# Patient Record
Sex: Female | Born: 1935 | Race: White | Hispanic: No | Marital: Married | Smoking: Never smoker
Health system: Southern US, Community
[De-identification: ages and names within clinical notes are randomized; demographics above are authoritative.]

## PROBLEM LIST (undated history)

## (undated) DIAGNOSIS — M1990 Unspecified osteoarthritis, unspecified site: Secondary | ICD-10-CM

## (undated) DIAGNOSIS — E079 Disorder of thyroid, unspecified: Secondary | ICD-10-CM

## (undated) DIAGNOSIS — L719 Rosacea, unspecified: Secondary | ICD-10-CM

## (undated) DIAGNOSIS — D510 Vitamin B12 deficiency anemia due to intrinsic factor deficiency: Secondary | ICD-10-CM

## (undated) DIAGNOSIS — I10 Essential (primary) hypertension: Secondary | ICD-10-CM

## (undated) DIAGNOSIS — C801 Malignant (primary) neoplasm, unspecified: Secondary | ICD-10-CM

## (undated) DIAGNOSIS — K219 Gastro-esophageal reflux disease without esophagitis: Secondary | ICD-10-CM

## (undated) DIAGNOSIS — I872 Venous insufficiency (chronic) (peripheral): Secondary | ICD-10-CM

## (undated) DIAGNOSIS — E669 Obesity, unspecified: Secondary | ICD-10-CM

## (undated) HISTORY — PX: TONSILLECTOMY AND ADENOIDECTOMY: SUR1326

## (undated) HISTORY — DX: Malignant (primary) neoplasm, unspecified: C80.1

## (undated) HISTORY — DX: Vitamin B12 deficiency anemia due to intrinsic factor deficiency: D51.0

## (undated) HISTORY — DX: Disorder of thyroid, unspecified: E07.9

## (undated) HISTORY — DX: Unspecified osteoarthritis, unspecified site: M19.90

## (undated) HISTORY — DX: Venous insufficiency (chronic) (peripheral): I87.2

## (undated) HISTORY — DX: Gastro-esophageal reflux disease without esophagitis: K21.9

## (undated) HISTORY — PX: CHOLECYSTECTOMY: SHX55

## (undated) HISTORY — PX: TOTAL THYROIDECTOMY: SHX2547

## (undated) HISTORY — DX: Obesity, unspecified: E66.9

## (undated) HISTORY — DX: Essential (primary) hypertension: I10

## (undated) HISTORY — DX: Rosacea, unspecified: L71.9

---

## 2011-06-15 DIAGNOSIS — J069 Acute upper respiratory infection, unspecified: Secondary | ICD-10-CM | POA: Diagnosis not present

## 2011-11-01 DIAGNOSIS — W57XXXA Bitten or stung by nonvenomous insect and other nonvenomous arthropods, initial encounter: Secondary | ICD-10-CM | POA: Diagnosis not present

## 2011-11-01 DIAGNOSIS — L820 Inflamed seborrheic keratosis: Secondary | ICD-10-CM | POA: Diagnosis not present

## 2011-11-01 DIAGNOSIS — T148 Other injury of unspecified body region: Secondary | ICD-10-CM | POA: Diagnosis not present

## 2011-11-01 DIAGNOSIS — L981 Factitial dermatitis: Secondary | ICD-10-CM | POA: Diagnosis not present

## 2011-11-25 DIAGNOSIS — Z9089 Acquired absence of other organs: Secondary | ICD-10-CM | POA: Diagnosis not present

## 2011-11-25 DIAGNOSIS — Z923 Personal history of irradiation: Secondary | ICD-10-CM | POA: Diagnosis not present

## 2011-11-25 DIAGNOSIS — C73 Malignant neoplasm of thyroid gland: Secondary | ICD-10-CM | POA: Diagnosis not present

## 2011-11-25 DIAGNOSIS — T888XXA Other specified complications of surgical and medical care, not elsewhere classified, initial encounter: Secondary | ICD-10-CM | POA: Diagnosis not present

## 2012-02-02 DIAGNOSIS — H2510 Age-related nuclear cataract, unspecified eye: Secondary | ICD-10-CM | POA: Diagnosis not present

## 2012-02-02 DIAGNOSIS — H04129 Dry eye syndrome of unspecified lacrimal gland: Secondary | ICD-10-CM | POA: Diagnosis not present

## 2012-02-08 DIAGNOSIS — R609 Edema, unspecified: Secondary | ICD-10-CM | POA: Diagnosis not present

## 2012-02-08 DIAGNOSIS — K219 Gastro-esophageal reflux disease without esophagitis: Secondary | ICD-10-CM | POA: Diagnosis not present

## 2012-02-08 DIAGNOSIS — I10 Essential (primary) hypertension: Secondary | ICD-10-CM | POA: Diagnosis not present

## 2012-02-08 DIAGNOSIS — Z1231 Encounter for screening mammogram for malignant neoplasm of breast: Secondary | ICD-10-CM | POA: Diagnosis not present

## 2012-02-08 DIAGNOSIS — Z23 Encounter for immunization: Secondary | ICD-10-CM | POA: Diagnosis not present

## 2012-03-27 DIAGNOSIS — L820 Inflamed seborrheic keratosis: Secondary | ICD-10-CM | POA: Diagnosis not present

## 2012-03-27 DIAGNOSIS — L821 Other seborrheic keratosis: Secondary | ICD-10-CM | POA: Diagnosis not present

## 2012-04-06 DIAGNOSIS — M25519 Pain in unspecified shoulder: Secondary | ICD-10-CM | POA: Diagnosis not present

## 2012-04-06 DIAGNOSIS — M67919 Unspecified disorder of synovium and tendon, unspecified shoulder: Secondary | ICD-10-CM | POA: Diagnosis not present

## 2012-04-06 DIAGNOSIS — M7520 Bicipital tendinitis, unspecified shoulder: Secondary | ICD-10-CM | POA: Diagnosis not present

## 2012-04-06 DIAGNOSIS — M19019 Primary osteoarthritis, unspecified shoulder: Secondary | ICD-10-CM | POA: Diagnosis not present

## 2012-04-06 DIAGNOSIS — M719 Bursopathy, unspecified: Secondary | ICD-10-CM | POA: Diagnosis not present

## 2012-04-13 DIAGNOSIS — Z79899 Other long term (current) drug therapy: Secondary | ICD-10-CM | POA: Diagnosis not present

## 2012-04-13 DIAGNOSIS — J309 Allergic rhinitis, unspecified: Secondary | ICD-10-CM | POA: Diagnosis not present

## 2012-04-13 DIAGNOSIS — I10 Essential (primary) hypertension: Secondary | ICD-10-CM | POA: Diagnosis not present

## 2012-04-13 DIAGNOSIS — D539 Nutritional anemia, unspecified: Secondary | ICD-10-CM | POA: Diagnosis not present

## 2012-04-30 DIAGNOSIS — M25529 Pain in unspecified elbow: Secondary | ICD-10-CM | POA: Diagnosis not present

## 2012-04-30 DIAGNOSIS — M7020 Olecranon bursitis, unspecified elbow: Secondary | ICD-10-CM | POA: Diagnosis not present

## 2012-05-14 DIAGNOSIS — M7020 Olecranon bursitis, unspecified elbow: Secondary | ICD-10-CM | POA: Diagnosis not present

## 2012-05-14 DIAGNOSIS — M25529 Pain in unspecified elbow: Secondary | ICD-10-CM | POA: Diagnosis not present

## 2012-05-18 DIAGNOSIS — M719 Bursopathy, unspecified: Secondary | ICD-10-CM | POA: Diagnosis not present

## 2012-05-18 DIAGNOSIS — M7520 Bicipital tendinitis, unspecified shoulder: Secondary | ICD-10-CM | POA: Diagnosis not present

## 2012-05-18 DIAGNOSIS — M19019 Primary osteoarthritis, unspecified shoulder: Secondary | ICD-10-CM | POA: Diagnosis not present

## 2012-05-18 DIAGNOSIS — M25519 Pain in unspecified shoulder: Secondary | ICD-10-CM | POA: Diagnosis not present

## 2012-05-18 DIAGNOSIS — M67919 Unspecified disorder of synovium and tendon, unspecified shoulder: Secondary | ICD-10-CM | POA: Diagnosis not present

## 2012-05-31 DIAGNOSIS — D510 Vitamin B12 deficiency anemia due to intrinsic factor deficiency: Secondary | ICD-10-CM | POA: Diagnosis not present

## 2012-05-31 DIAGNOSIS — K219 Gastro-esophageal reflux disease without esophagitis: Secondary | ICD-10-CM | POA: Diagnosis not present

## 2012-05-31 DIAGNOSIS — E039 Hypothyroidism, unspecified: Secondary | ICD-10-CM | POA: Diagnosis not present

## 2012-05-31 DIAGNOSIS — I10 Essential (primary) hypertension: Secondary | ICD-10-CM | POA: Diagnosis not present

## 2012-06-08 DIAGNOSIS — D510 Vitamin B12 deficiency anemia due to intrinsic factor deficiency: Secondary | ICD-10-CM | POA: Diagnosis not present

## 2012-06-14 DIAGNOSIS — D510 Vitamin B12 deficiency anemia due to intrinsic factor deficiency: Secondary | ICD-10-CM | POA: Diagnosis not present

## 2012-06-18 DIAGNOSIS — M19019 Primary osteoarthritis, unspecified shoulder: Secondary | ICD-10-CM | POA: Diagnosis not present

## 2012-06-18 DIAGNOSIS — M25519 Pain in unspecified shoulder: Secondary | ICD-10-CM | POA: Diagnosis not present

## 2012-06-18 DIAGNOSIS — M719 Bursopathy, unspecified: Secondary | ICD-10-CM | POA: Diagnosis not present

## 2012-06-18 DIAGNOSIS — M67919 Unspecified disorder of synovium and tendon, unspecified shoulder: Secondary | ICD-10-CM | POA: Diagnosis not present

## 2012-06-18 DIAGNOSIS — M7020 Olecranon bursitis, unspecified elbow: Secondary | ICD-10-CM | POA: Diagnosis not present

## 2012-06-22 DIAGNOSIS — D510 Vitamin B12 deficiency anemia due to intrinsic factor deficiency: Secondary | ICD-10-CM | POA: Diagnosis not present

## 2012-06-29 DIAGNOSIS — D510 Vitamin B12 deficiency anemia due to intrinsic factor deficiency: Secondary | ICD-10-CM | POA: Diagnosis not present

## 2012-07-27 DIAGNOSIS — D510 Vitamin B12 deficiency anemia due to intrinsic factor deficiency: Secondary | ICD-10-CM | POA: Diagnosis not present

## 2012-08-15 ENCOUNTER — Other Ambulatory Visit: Payer: Self-pay | Admitting: Physician Assistant

## 2012-08-15 DIAGNOSIS — K56609 Unspecified intestinal obstruction, unspecified as to partial versus complete obstruction: Secondary | ICD-10-CM | POA: Diagnosis not present

## 2012-08-15 DIAGNOSIS — K635 Polyp of colon: Secondary | ICD-10-CM

## 2012-08-15 DIAGNOSIS — Z86010 Personal history of colonic polyps: Secondary | ICD-10-CM | POA: Diagnosis not present

## 2012-08-15 DIAGNOSIS — Z800 Family history of malignant neoplasm of digestive organs: Secondary | ICD-10-CM | POA: Diagnosis not present

## 2012-08-23 ENCOUNTER — Ambulatory Visit
Admission: RE | Admit: 2012-08-23 | Discharge: 2012-08-23 | Disposition: A | Discharge status: Home / Self Care | Payer: Medicare Other | Source: Ambulatory Visit | Attending: Physician Assistant | Admitting: Physician Assistant

## 2012-08-23 DIAGNOSIS — K635 Polyp of colon: Secondary | ICD-10-CM

## 2012-08-23 DIAGNOSIS — K5730 Diverticulosis of large intestine without perforation or abscess without bleeding: Secondary | ICD-10-CM | POA: Diagnosis not present

## 2012-08-24 ENCOUNTER — Other Ambulatory Visit: Payer: Self-pay

## 2012-08-27 DIAGNOSIS — D510 Vitamin B12 deficiency anemia due to intrinsic factor deficiency: Secondary | ICD-10-CM | POA: Diagnosis not present

## 2012-09-20 DIAGNOSIS — L981 Factitial dermatitis: Secondary | ICD-10-CM | POA: Diagnosis not present

## 2012-09-20 DIAGNOSIS — L821 Other seborrheic keratosis: Secondary | ICD-10-CM | POA: Diagnosis not present

## 2012-09-20 DIAGNOSIS — L820 Inflamed seborrheic keratosis: Secondary | ICD-10-CM | POA: Diagnosis not present

## 2012-10-01 DIAGNOSIS — D510 Vitamin B12 deficiency anemia due to intrinsic factor deficiency: Secondary | ICD-10-CM | POA: Diagnosis not present

## 2012-11-01 DIAGNOSIS — D510 Vitamin B12 deficiency anemia due to intrinsic factor deficiency: Secondary | ICD-10-CM | POA: Diagnosis not present

## 2012-11-27 DIAGNOSIS — D510 Vitamin B12 deficiency anemia due to intrinsic factor deficiency: Secondary | ICD-10-CM | POA: Diagnosis not present

## 2012-12-07 DIAGNOSIS — C73 Malignant neoplasm of thyroid gland: Secondary | ICD-10-CM | POA: Diagnosis not present

## 2012-12-07 DIAGNOSIS — Z9889 Other specified postprocedural states: Secondary | ICD-10-CM | POA: Diagnosis not present

## 2012-12-10 DIAGNOSIS — I8310 Varicose veins of unspecified lower extremity with inflammation: Secondary | ICD-10-CM | POA: Diagnosis not present

## 2012-12-10 DIAGNOSIS — R233 Spontaneous ecchymoses: Secondary | ICD-10-CM | POA: Diagnosis not present

## 2012-12-31 DIAGNOSIS — D510 Vitamin B12 deficiency anemia due to intrinsic factor deficiency: Secondary | ICD-10-CM | POA: Diagnosis not present

## 2013-01-02 DIAGNOSIS — I8310 Varicose veins of unspecified lower extremity with inflammation: Secondary | ICD-10-CM | POA: Diagnosis not present

## 2013-01-02 DIAGNOSIS — L538 Other specified erythematous conditions: Secondary | ICD-10-CM | POA: Diagnosis not present

## 2013-01-08 DIAGNOSIS — L97909 Non-pressure chronic ulcer of unspecified part of unspecified lower leg with unspecified severity: Secondary | ICD-10-CM | POA: Diagnosis not present

## 2013-01-15 DIAGNOSIS — I83009 Varicose veins of unspecified lower extremity with ulcer of unspecified site: Secondary | ICD-10-CM | POA: Diagnosis not present

## 2013-01-21 DIAGNOSIS — I83009 Varicose veins of unspecified lower extremity with ulcer of unspecified site: Secondary | ICD-10-CM | POA: Diagnosis not present

## 2013-02-05 DIAGNOSIS — D510 Vitamin B12 deficiency anemia due to intrinsic factor deficiency: Secondary | ICD-10-CM | POA: Diagnosis not present

## 2013-02-14 DIAGNOSIS — I83009 Varicose veins of unspecified lower extremity with ulcer of unspecified site: Secondary | ICD-10-CM | POA: Diagnosis not present

## 2013-03-14 DIAGNOSIS — D510 Vitamin B12 deficiency anemia due to intrinsic factor deficiency: Secondary | ICD-10-CM | POA: Diagnosis not present

## 2013-03-14 DIAGNOSIS — Z23 Encounter for immunization: Secondary | ICD-10-CM | POA: Diagnosis not present

## 2013-03-19 DIAGNOSIS — Z1231 Encounter for screening mammogram for malignant neoplasm of breast: Secondary | ICD-10-CM | POA: Diagnosis not present

## 2013-03-27 DIAGNOSIS — L821 Other seborrheic keratosis: Secondary | ICD-10-CM | POA: Diagnosis not present

## 2013-03-27 DIAGNOSIS — D235 Other benign neoplasm of skin of trunk: Secondary | ICD-10-CM | POA: Diagnosis not present

## 2013-03-27 DIAGNOSIS — D1739 Benign lipomatous neoplasm of skin and subcutaneous tissue of other sites: Secondary | ICD-10-CM | POA: Diagnosis not present

## 2013-03-27 DIAGNOSIS — D1801 Hemangioma of skin and subcutaneous tissue: Secondary | ICD-10-CM | POA: Diagnosis not present

## 2013-04-03 DIAGNOSIS — L049 Acute lymphadenitis, unspecified: Secondary | ICD-10-CM | POA: Diagnosis not present

## 2013-04-04 DIAGNOSIS — I10 Essential (primary) hypertension: Secondary | ICD-10-CM | POA: Diagnosis not present

## 2013-04-04 DIAGNOSIS — E039 Hypothyroidism, unspecified: Secondary | ICD-10-CM | POA: Diagnosis not present

## 2013-04-04 DIAGNOSIS — D510 Vitamin B12 deficiency anemia due to intrinsic factor deficiency: Secondary | ICD-10-CM | POA: Diagnosis not present

## 2013-04-04 DIAGNOSIS — Z006 Encounter for examination for normal comparison and control in clinical research program: Secondary | ICD-10-CM | POA: Diagnosis not present

## 2013-04-22 DIAGNOSIS — M19019 Primary osteoarthritis, unspecified shoulder: Secondary | ICD-10-CM | POA: Diagnosis not present

## 2013-04-22 DIAGNOSIS — M25519 Pain in unspecified shoulder: Secondary | ICD-10-CM | POA: Diagnosis not present

## 2013-04-23 DIAGNOSIS — E559 Vitamin D deficiency, unspecified: Secondary | ICD-10-CM | POA: Diagnosis not present

## 2013-04-23 DIAGNOSIS — R5381 Other malaise: Secondary | ICD-10-CM | POA: Diagnosis not present

## 2013-04-23 DIAGNOSIS — Z0000 Encounter for general adult medical examination without abnormal findings: Secondary | ICD-10-CM | POA: Diagnosis not present

## 2013-04-23 DIAGNOSIS — D510 Vitamin B12 deficiency anemia due to intrinsic factor deficiency: Secondary | ICD-10-CM | POA: Diagnosis not present

## 2013-04-23 DIAGNOSIS — R0989 Other specified symptoms and signs involving the circulatory and respiratory systems: Secondary | ICD-10-CM | POA: Diagnosis not present

## 2013-05-14 DIAGNOSIS — R0989 Other specified symptoms and signs involving the circulatory and respiratory systems: Secondary | ICD-10-CM | POA: Diagnosis not present

## 2013-06-03 DIAGNOSIS — M7500 Adhesive capsulitis of unspecified shoulder: Secondary | ICD-10-CM | POA: Diagnosis not present

## 2013-06-20 DIAGNOSIS — M546 Pain in thoracic spine: Secondary | ICD-10-CM | POA: Diagnosis not present

## 2013-06-25 DIAGNOSIS — D510 Vitamin B12 deficiency anemia due to intrinsic factor deficiency: Secondary | ICD-10-CM | POA: Diagnosis not present

## 2013-07-09 DIAGNOSIS — R197 Diarrhea, unspecified: Secondary | ICD-10-CM | POA: Diagnosis not present

## 2013-07-09 DIAGNOSIS — K922 Gastrointestinal hemorrhage, unspecified: Secondary | ICD-10-CM | POA: Diagnosis not present

## 2013-07-10 DIAGNOSIS — M1710 Unilateral primary osteoarthritis, unspecified knee: Secondary | ICD-10-CM | POA: Diagnosis not present

## 2013-07-11 DIAGNOSIS — K5731 Diverticulosis of large intestine without perforation or abscess with bleeding: Secondary | ICD-10-CM | POA: Diagnosis not present

## 2013-07-11 DIAGNOSIS — K922 Gastrointestinal hemorrhage, unspecified: Secondary | ICD-10-CM | POA: Diagnosis not present

## 2013-07-19 DIAGNOSIS — K5731 Diverticulosis of large intestine without perforation or abscess with bleeding: Secondary | ICD-10-CM | POA: Diagnosis not present

## 2013-08-02 DIAGNOSIS — D510 Vitamin B12 deficiency anemia due to intrinsic factor deficiency: Secondary | ICD-10-CM | POA: Diagnosis not present

## 2013-08-02 DIAGNOSIS — E559 Vitamin D deficiency, unspecified: Secondary | ICD-10-CM | POA: Diagnosis not present

## 2013-09-10 DIAGNOSIS — D510 Vitamin B12 deficiency anemia due to intrinsic factor deficiency: Secondary | ICD-10-CM | POA: Diagnosis not present

## 2013-09-10 DIAGNOSIS — J309 Allergic rhinitis, unspecified: Secondary | ICD-10-CM | POA: Diagnosis not present

## 2013-09-25 DIAGNOSIS — J309 Allergic rhinitis, unspecified: Secondary | ICD-10-CM | POA: Diagnosis not present

## 2013-10-08 DIAGNOSIS — M1990 Unspecified osteoarthritis, unspecified site: Secondary | ICD-10-CM | POA: Diagnosis not present

## 2013-10-16 DIAGNOSIS — M1710 Unilateral primary osteoarthritis, unspecified knee: Secondary | ICD-10-CM | POA: Diagnosis not present

## 2013-10-16 DIAGNOSIS — IMO0002 Reserved for concepts with insufficient information to code with codable children: Secondary | ICD-10-CM | POA: Diagnosis not present

## 2013-10-23 DIAGNOSIS — M1710 Unilateral primary osteoarthritis, unspecified knee: Secondary | ICD-10-CM | POA: Diagnosis not present

## 2013-10-23 DIAGNOSIS — IMO0002 Reserved for concepts with insufficient information to code with codable children: Secondary | ICD-10-CM | POA: Diagnosis not present

## 2013-11-01 DIAGNOSIS — IMO0002 Reserved for concepts with insufficient information to code with codable children: Secondary | ICD-10-CM | POA: Diagnosis not present

## 2013-11-01 DIAGNOSIS — M1710 Unilateral primary osteoarthritis, unspecified knee: Secondary | ICD-10-CM | POA: Diagnosis not present

## 2013-11-08 DIAGNOSIS — Z79899 Other long term (current) drug therapy: Secondary | ICD-10-CM | POA: Diagnosis not present

## 2013-11-08 DIAGNOSIS — E559 Vitamin D deficiency, unspecified: Secondary | ICD-10-CM | POA: Diagnosis not present

## 2013-11-08 DIAGNOSIS — D510 Vitamin B12 deficiency anemia due to intrinsic factor deficiency: Secondary | ICD-10-CM | POA: Diagnosis not present

## 2013-12-12 DIAGNOSIS — C73 Malignant neoplasm of thyroid gland: Secondary | ICD-10-CM | POA: Diagnosis not present

## 2013-12-16 DIAGNOSIS — M76899 Other specified enthesopathies of unspecified lower limb, excluding foot: Secondary | ICD-10-CM | POA: Diagnosis not present

## 2013-12-20 DIAGNOSIS — D510 Vitamin B12 deficiency anemia due to intrinsic factor deficiency: Secondary | ICD-10-CM | POA: Diagnosis not present

## 2014-01-20 DIAGNOSIS — D510 Vitamin B12 deficiency anemia due to intrinsic factor deficiency: Secondary | ICD-10-CM | POA: Diagnosis not present

## 2014-02-17 DIAGNOSIS — M79609 Pain in unspecified limb: Secondary | ICD-10-CM | POA: Diagnosis not present

## 2014-02-17 DIAGNOSIS — I8000 Phlebitis and thrombophlebitis of superficial vessels of unspecified lower extremity: Secondary | ICD-10-CM | POA: Diagnosis not present

## 2014-02-17 DIAGNOSIS — I8390 Asymptomatic varicose veins of unspecified lower extremity: Secondary | ICD-10-CM | POA: Diagnosis not present

## 2014-02-18 DIAGNOSIS — I803 Phlebitis and thrombophlebitis of lower extremities, unspecified: Secondary | ICD-10-CM | POA: Diagnosis not present

## 2014-02-18 DIAGNOSIS — Z23 Encounter for immunization: Secondary | ICD-10-CM | POA: Diagnosis not present

## 2014-02-18 DIAGNOSIS — I872 Venous insufficiency (chronic) (peripheral): Secondary | ICD-10-CM | POA: Diagnosis not present

## 2014-02-18 DIAGNOSIS — D510 Vitamin B12 deficiency anemia due to intrinsic factor deficiency: Secondary | ICD-10-CM | POA: Diagnosis not present

## 2014-03-11 DIAGNOSIS — M7061 Trochanteric bursitis, right hip: Secondary | ICD-10-CM | POA: Diagnosis not present

## 2014-03-19 DIAGNOSIS — H2513 Age-related nuclear cataract, bilateral: Secondary | ICD-10-CM | POA: Diagnosis not present

## 2014-03-19 DIAGNOSIS — H524 Presbyopia: Secondary | ICD-10-CM | POA: Diagnosis not present

## 2014-03-20 ENCOUNTER — Encounter: Payer: Self-pay | Admitting: Vascular Surgery

## 2014-03-20 ENCOUNTER — Other Ambulatory Visit: Payer: Self-pay | Admitting: *Deleted

## 2014-03-20 DIAGNOSIS — I872 Venous insufficiency (chronic) (peripheral): Secondary | ICD-10-CM

## 2014-04-08 DIAGNOSIS — Z1231 Encounter for screening mammogram for malignant neoplasm of breast: Secondary | ICD-10-CM | POA: Diagnosis not present

## 2014-04-10 ENCOUNTER — Encounter: Payer: 59 | Admitting: Vascular Surgery

## 2014-04-10 ENCOUNTER — Encounter (HOSPITAL_COMMUNITY): Payer: 59

## 2014-04-16 DIAGNOSIS — Z79899 Other long term (current) drug therapy: Secondary | ICD-10-CM | POA: Diagnosis not present

## 2014-04-16 DIAGNOSIS — F419 Anxiety disorder, unspecified: Secondary | ICD-10-CM | POA: Diagnosis not present

## 2014-04-16 DIAGNOSIS — M545 Low back pain: Secondary | ICD-10-CM | POA: Diagnosis not present

## 2014-04-16 DIAGNOSIS — M150 Primary generalized (osteo)arthritis: Secondary | ICD-10-CM | POA: Diagnosis not present

## 2014-04-16 DIAGNOSIS — M461 Sacroiliitis, not elsewhere classified: Secondary | ICD-10-CM | POA: Diagnosis not present

## 2014-04-16 DIAGNOSIS — E039 Hypothyroidism, unspecified: Secondary | ICD-10-CM | POA: Diagnosis not present

## 2014-04-16 DIAGNOSIS — M5441 Lumbago with sciatica, right side: Secondary | ICD-10-CM | POA: Diagnosis not present

## 2014-04-30 DIAGNOSIS — M5441 Lumbago with sciatica, right side: Secondary | ICD-10-CM | POA: Diagnosis not present

## 2014-04-30 DIAGNOSIS — D510 Vitamin B12 deficiency anemia due to intrinsic factor deficiency: Secondary | ICD-10-CM | POA: Diagnosis not present

## 2014-04-30 DIAGNOSIS — M545 Low back pain: Secondary | ICD-10-CM | POA: Diagnosis not present

## 2014-04-30 DIAGNOSIS — M47816 Spondylosis without myelopathy or radiculopathy, lumbar region: Secondary | ICD-10-CM | POA: Diagnosis not present

## 2014-04-30 DIAGNOSIS — M150 Primary generalized (osteo)arthritis: Secondary | ICD-10-CM | POA: Diagnosis not present

## 2014-05-07 DIAGNOSIS — F419 Anxiety disorder, unspecified: Secondary | ICD-10-CM | POA: Diagnosis not present

## 2014-05-07 DIAGNOSIS — M150 Primary generalized (osteo)arthritis: Secondary | ICD-10-CM | POA: Diagnosis not present

## 2014-05-07 DIAGNOSIS — E039 Hypothyroidism, unspecified: Secondary | ICD-10-CM | POA: Diagnosis not present

## 2014-05-07 DIAGNOSIS — M461 Sacroiliitis, not elsewhere classified: Secondary | ICD-10-CM | POA: Diagnosis not present

## 2014-05-07 DIAGNOSIS — M5441 Lumbago with sciatica, right side: Secondary | ICD-10-CM | POA: Diagnosis not present

## 2014-05-07 DIAGNOSIS — M545 Low back pain: Secondary | ICD-10-CM | POA: Diagnosis not present

## 2014-05-07 DIAGNOSIS — Z79899 Other long term (current) drug therapy: Secondary | ICD-10-CM | POA: Diagnosis not present

## 2014-06-02 DIAGNOSIS — D510 Vitamin B12 deficiency anemia due to intrinsic factor deficiency: Secondary | ICD-10-CM | POA: Diagnosis not present

## 2014-06-04 DIAGNOSIS — M461 Sacroiliitis, not elsewhere classified: Secondary | ICD-10-CM | POA: Diagnosis not present

## 2014-06-04 DIAGNOSIS — Z79899 Other long term (current) drug therapy: Secondary | ICD-10-CM | POA: Diagnosis not present

## 2014-06-04 DIAGNOSIS — F419 Anxiety disorder, unspecified: Secondary | ICD-10-CM | POA: Diagnosis not present

## 2014-06-04 DIAGNOSIS — M545 Low back pain: Secondary | ICD-10-CM | POA: Diagnosis not present

## 2014-06-04 DIAGNOSIS — M5441 Lumbago with sciatica, right side: Secondary | ICD-10-CM | POA: Diagnosis not present

## 2014-06-04 DIAGNOSIS — E039 Hypothyroidism, unspecified: Secondary | ICD-10-CM | POA: Diagnosis not present

## 2014-06-04 DIAGNOSIS — G8929 Other chronic pain: Secondary | ICD-10-CM | POA: Diagnosis not present

## 2014-06-04 DIAGNOSIS — M150 Primary generalized (osteo)arthritis: Secondary | ICD-10-CM | POA: Diagnosis not present

## 2014-06-04 DIAGNOSIS — G894 Chronic pain syndrome: Secondary | ICD-10-CM | POA: Diagnosis not present

## 2014-07-08 DIAGNOSIS — D510 Vitamin B12 deficiency anemia due to intrinsic factor deficiency: Secondary | ICD-10-CM | POA: Diagnosis not present

## 2014-07-16 DIAGNOSIS — M461 Sacroiliitis, not elsewhere classified: Secondary | ICD-10-CM | POA: Diagnosis not present

## 2014-07-16 DIAGNOSIS — E039 Hypothyroidism, unspecified: Secondary | ICD-10-CM | POA: Diagnosis not present

## 2014-07-16 DIAGNOSIS — F419 Anxiety disorder, unspecified: Secondary | ICD-10-CM | POA: Diagnosis not present

## 2014-07-16 DIAGNOSIS — M5441 Lumbago with sciatica, right side: Secondary | ICD-10-CM | POA: Diagnosis not present

## 2014-07-16 DIAGNOSIS — M150 Primary generalized (osteo)arthritis: Secondary | ICD-10-CM | POA: Diagnosis not present

## 2014-07-16 DIAGNOSIS — G894 Chronic pain syndrome: Secondary | ICD-10-CM | POA: Diagnosis not present

## 2014-07-16 DIAGNOSIS — Z79899 Other long term (current) drug therapy: Secondary | ICD-10-CM | POA: Diagnosis not present

## 2014-07-16 DIAGNOSIS — M545 Low back pain: Secondary | ICD-10-CM | POA: Diagnosis not present

## 2014-07-18 DIAGNOSIS — F329 Major depressive disorder, single episode, unspecified: Secondary | ICD-10-CM | POA: Diagnosis not present

## 2014-07-18 DIAGNOSIS — I803 Phlebitis and thrombophlebitis of lower extremities, unspecified: Secondary | ICD-10-CM | POA: Diagnosis not present

## 2014-07-18 DIAGNOSIS — I8393 Asymptomatic varicose veins of bilateral lower extremities: Secondary | ICD-10-CM | POA: Diagnosis not present

## 2014-08-20 DIAGNOSIS — D510 Vitamin B12 deficiency anemia due to intrinsic factor deficiency: Secondary | ICD-10-CM | POA: Diagnosis not present

## 2014-08-25 DIAGNOSIS — I872 Venous insufficiency (chronic) (peripheral): Secondary | ICD-10-CM | POA: Diagnosis not present

## 2014-09-09 DIAGNOSIS — I872 Venous insufficiency (chronic) (peripheral): Secondary | ICD-10-CM | POA: Diagnosis not present

## 2014-09-23 DIAGNOSIS — Z0000 Encounter for general adult medical examination without abnormal findings: Secondary | ICD-10-CM | POA: Diagnosis not present

## 2014-09-23 DIAGNOSIS — I872 Venous insufficiency (chronic) (peripheral): Secondary | ICD-10-CM | POA: Diagnosis not present

## 2014-09-24 DIAGNOSIS — J302 Other seasonal allergic rhinitis: Secondary | ICD-10-CM | POA: Diagnosis not present

## 2014-09-24 DIAGNOSIS — M1990 Unspecified osteoarthritis, unspecified site: Secondary | ICD-10-CM | POA: Diagnosis not present

## 2014-09-24 DIAGNOSIS — D510 Vitamin B12 deficiency anemia due to intrinsic factor deficiency: Secondary | ICD-10-CM | POA: Diagnosis not present

## 2014-09-24 DIAGNOSIS — F419 Anxiety disorder, unspecified: Secondary | ICD-10-CM | POA: Diagnosis not present

## 2014-09-26 DIAGNOSIS — D510 Vitamin B12 deficiency anemia due to intrinsic factor deficiency: Secondary | ICD-10-CM | POA: Diagnosis not present

## 2014-09-26 DIAGNOSIS — E876 Hypokalemia: Secondary | ICD-10-CM | POA: Diagnosis not present

## 2014-10-08 ENCOUNTER — Encounter: Payer: Self-pay | Admitting: Vascular Surgery

## 2014-10-09 ENCOUNTER — Encounter (HOSPITAL_COMMUNITY): Payer: 59

## 2014-10-09 ENCOUNTER — Other Ambulatory Visit: Payer: Self-pay | Admitting: *Deleted

## 2014-10-09 ENCOUNTER — Ambulatory Visit (HOSPITAL_COMMUNITY)
Admission: RE | Admit: 2014-10-09 | Discharge: 2014-10-09 | Disposition: A | Discharge status: Home / Self Care | Payer: Medicare Other | Source: Ambulatory Visit | Attending: Vascular Surgery | Admitting: Vascular Surgery

## 2014-10-09 ENCOUNTER — Encounter: Payer: Self-pay | Admitting: Vascular Surgery

## 2014-10-09 ENCOUNTER — Ambulatory Visit (INDEPENDENT_AMBULATORY_CARE_PROVIDER_SITE_OTHER): Payer: Medicare Other | Admitting: Vascular Surgery

## 2014-10-09 VITALS — BP 161/82 | HR 81 | Resp 18 | Ht 65.0 in | Wt 190.9 lb

## 2014-10-09 DIAGNOSIS — I83893 Varicose veins of bilateral lower extremities with other complications: Secondary | ICD-10-CM | POA: Diagnosis not present

## 2014-10-09 DIAGNOSIS — R0989 Other specified symptoms and signs involving the circulatory and respiratory systems: Secondary | ICD-10-CM

## 2014-10-09 LAB — VAS US CAROTID
LCCAPDIAS: 17 cm/s
LCCAPSYS: 132 cm/s
LEFT CCA MID DIAS: 24 R/I
LEFT CCA MID SYS: 131 cm/s
LEFT ECA DIAS: 8 cm/s
LICAMSYS: 114 cm/s
LICAPSYS: 86 cm/s
Left CCA dist dias: 20 cm/s
Left CCA dist sys: 108 cm/s
Left ECA sys: 114 cm/s
Left ICA dist dias: 24 cm/s
Left ICA dist sys: 130 cm/s
Left ICA mid dias: 28 cm/s
Left ICA prox dias: 20 cm/s
RICADDIAS: 23 cm/s
RICADSYS: 92 cm/s
RICAMSYS: 94 cm/s
RIGHT CCA MID DIAS: 15 cm/s
RIGHT CCA MID SYS: 102 cm/s
RIGHT ECA DIAS: 7 cm/s
Right CCA dist dias: 18 cm/s
Right CCA prox dias: 13 cm/s
Right CCA prox sys: 106 cm/s
Right ICA mid dias: 25 cm/s
Right ICA prox dias: 22 cm/s
Right ICA prox sys: 80 cm/s
Right cca dist sys: 90 cm/s
Right eca sys: 124 cm/s

## 2014-10-09 NOTE — Progress Notes (Addendum)
HISTORY AND PHYSICAL     CC:  Leg swelling Referring Provider:  Dr. Lin Landsman   HPI: This is a 81 y.o. female who presents today for swelling of her legs (left more than right).  She states that she has had this for years.  Years ago, her blood pressure medication was changed and her leg swelling improved for a while.  Her legs have started swelling again and she has now been placed on Lasix.  She says this has helped only minimally.  She states she did have a sore on her foot that bled 3 or 4 years ago, but this healed and she has not had any issues since.  She does wear knee high compression stockings and has for years.    She denies any hx of deep vein thrombosis, but did have a superficial clot in the left leg in the past.  She denies any hx of claudication.  She denies chest pain or hx of MI.  She does have a family hx of varicose veins with her father and sister.   She has never smoked.  Past Medical History  Diagnosis Date  . Venous (peripheral) insufficiency   . Rosacea   . Cancer     thyroid  . Obesity   . Pernicious anemia   . Thyroid disease   . Esophageal reflux   . Benign essential HTN   . Osteoarthrosis     Past Surgical History  Procedure Laterality Date  . Cholecystectomy    . Total thyroidectomy    . Tonsillectomy and adenoidectomy      Allergies  Allergen Reactions  . Azithromycin   . Dexilant [Dexlansoprazole]   . Motrin [Ibuprofen]   . Nexium [Esomeprazole Magnesium]   . Reglan [Metoclopramide]     Current Outpatient Prescriptions  Medication Sig Dispense Refill  . aspirin 81 MG tablet Take 81 mg by mouth daily.    . cetirizine (ZYRTEC) 10 MG tablet Take 10 mg by mouth daily.    . cyanocobalamin (,VITAMIN B-12,) 1000 MCG/ML injection Inject 1,000 mcg into the muscle every 30 (thirty) days.    Marland Kitchen FLUTICASONE PROPIONATE  HFA IN Inhale 2 sprays into the lungs.    . furosemide (LASIX) 20 MG tablet Take 20 mg by mouth daily.    Marland Kitchen  HYDROcodone-acetaminophen (NORCO/VICODIN) 5-325 MG per tablet Take 1 tablet by mouth every 6 (six) hours as needed for moderate pain.    Marland Kitchen levothyroxine (SYNTHROID, LEVOTHROID) 137 MCG tablet Take 137 mcg by mouth daily before breakfast.    . omeprazole (PRILOSEC) 20 MG capsule Take 20 mg by mouth 2 (two) times daily.    . potassium chloride (K-DUR,KLOR-CON) 10 MEQ tablet Take 10 mEq by mouth 2 (two) times daily.    Marland Kitchen ALPRAZolam (XANAX) 0.25 MG tablet Take 0.25 mg by mouth at bedtime as needed for anxiety.    . cholecalciferol (VITAMIN D) 400 UNITS TABS tablet Take 400 Units by mouth.    . gabapentin (NEURONTIN) 100 MG capsule Take 100 mg by mouth at bedtime.    Marland Kitchen lisinopril (PRINIVIL,ZESTRIL) 10 MG tablet Take 10 mg by mouth daily.    Marland Kitchen triamterene-hydrochlorothiazide (MAXZIDE-25) 37.5-25 MG per tablet Take 1 tablet by mouth daily.     No current facility-administered medications for this visit.    Family History  Problem Relation Age of Onset  . Alzheimer's disease Mother   . Cancer Father     colon    History   Social History  .  Marital Status: Married    Spouse Name: N/A  . Number of Children: N/A  . Years of Education: N/A   Occupational History  . Not on file.   Social History Main Topics  . Smoking status: Never Smoker   . Smokeless tobacco: Not on file  . Alcohol Use: No  . Drug Use: No  . Sexual Activity: Not on file   Other Topics Concern  . Not on file   Social History Narrative     ROS: [x]  Positive   [ ]  Negative   [ ]  All sytems reviewed and are negative  Cardiovascular: []  chest pain/pressure []  palpitations []  SOB lying flat []  DOE []  pain in legs while walking []  pain in feet when lying flat []  hx of DVT [x]  hx of phlebitis [x]  swelling in legs [x]  varicose veins  Pulmonary: []  productive cough []  asthma []  wheezing  Neurologic: []  weakness in []  arms []  legs []  numbness in []  arms []  legs [] difficulty speaking or slurred speech []   temporary loss of vision in one eye []  dizziness  Hematologic: []  bleeding problems []  problems with blood clotting easily  GI []  vomiting blood []  blood in stool  GU: []  burning with urination []  blood in urine  Psychiatric: []  hx of major depression  Integumentary: []  rashes []  ulcers  Constitutional: []  fever []  chills   PHYSICAL EXAMINATION:  Filed Vitals:   10/09/14 1246  BP: 161/82  Pulse: 81  Resp:    Body mass index is 31.77 kg/(m^2).  General:  WDWN in NAD Gait: Not observed HENT: WNL, normocephalic Pulmonary: normal non-labored breathing , without Rales, rhonchi,  wheezing Cardiac: RRR, without  Murmurs, rubs or gallops; with left carotid bruit Abdomen: soft, NT, no masses Skin: without rashes, without ulcers  Vascular Exam/Pulses:  Right Left  Radial 2+ (normal) 2+ (normal)  Ulnar 2+ (normal) 2+ (normal)  Popliteal 2+ (normal) 2+ (normal)  DP 2+ (normal) 2+ (normal)  PT 2+ (normal) 2+ (normal)   Extremities: without ischemic changes, without Gangrene , without cellulitis; without open wounds; she does have swelling in the left leg; large clusters of varicosities bilateral calves primarily ~ 6-7mm in diameter.  She does have telangiectasias on both feet. Musculoskeletal: no muscle wasting or atrophy  Neurologic: A&O X 3; Appropriate Affect ; SENSATION: normal; MOTOR FUNCTION:  moving all extremities equally. Speech is fluent/normal   Non-Invasive Vascular Imaging:   Bilateral lower extremity duplex [facility] 09/09/14 1.  No evidence of DVT 2.  Reflux of the right great saphenous vein (above and below knee), small saphenous vein, as well as anterior accessory saphenous vein of the thigh.  Reflux contributes to varicosities of the thigh and posterior calf. 3.  Reflux of the left great saphenous vein (above and below knee) and the anterior accessory saphenous vein of the thigh contributing to thigh varicosities.   Multiple reflectors within  superficial veins compatible with phleboliths/calcifications from prior episode of superficial venous thrombosis  Pt meds includes: Statin:  No. Beta Blocker:  No. Aspirin:  Yes.   ACEI:  Yes.   ARB:  No. Other Antiplatelet/Anticoagulant:  No.    ASSESSMENT/PLAN:: 81 y.o. female with leg swelling with the left greater than the right   -the pt is reassured that this is not a life threatening or threatened limb problem.  She will continue to wear her compression stockings and leg elevation.  She is not interested in any invasive procedures.  -she does have a left  carotid bruit on exam and bilateral carotid duplex is obtained today.  There is 1-39% bilaterally.  There was some tortuosity of the carotid artery, which could be the reason for the carotid bruit.   Jennifer Locket, PA-C [facility] [phone]  Clinic MD:  Pt seen and examined in conjunction with Dr. Oneida Alar  History and exam details as above. Patient was reassured today that although she does have varicose veins and some swelling of her legs this does not put her at increased risk for limb loss. She was very satisfied with an intervention of compression stockings alone this point. She is not currently extremely thin laser ablation of her vein.  Left carotid bruit was evaluated with a carotid duplex scan today which showed no significant carotid stenosis.  The patient will follow-up on as-needed basis if she wishes to consider further evaluation of her varicose veins or laser ablation of these. Again she has patent palpable pulses within normal arterial exam in this does not put her at risk for limb loss.  Jennifer Hinds, MD [facility] [place] Office: [phone] Pager: [phone]

## 2014-10-09 NOTE — Progress Notes (Signed)
Filed Vitals:   10/09/14 1238 10/09/14 1246  BP: 160/69 161/82  Pulse: 87 81  Resp: 18   Height: 5\' 5"  (1.651 m)   Weight: 190 lb 14.4 oz (86.592 kg)   Body mass index is 31.77 kg/(m^2).

## 2014-10-14 DIAGNOSIS — M2140 Flat foot [pes planus] (acquired), unspecified foot: Secondary | ICD-10-CM | POA: Diagnosis not present

## 2014-10-14 DIAGNOSIS — M25572 Pain in left ankle and joints of left foot: Secondary | ICD-10-CM | POA: Diagnosis not present

## 2014-10-14 DIAGNOSIS — M2142 Flat foot [pes planus] (acquired), left foot: Secondary | ICD-10-CM | POA: Diagnosis not present

## 2014-10-17 DIAGNOSIS — R262 Difficulty in walking, not elsewhere classified: Secondary | ICD-10-CM | POA: Diagnosis not present

## 2014-10-17 DIAGNOSIS — M79671 Pain in right foot: Secondary | ICD-10-CM | POA: Diagnosis not present

## 2014-10-17 DIAGNOSIS — M2142 Flat foot [pes planus] (acquired), left foot: Secondary | ICD-10-CM | POA: Diagnosis not present

## 2014-10-21 DIAGNOSIS — R262 Difficulty in walking, not elsewhere classified: Secondary | ICD-10-CM | POA: Diagnosis not present

## 2014-10-21 DIAGNOSIS — M79671 Pain in right foot: Secondary | ICD-10-CM | POA: Diagnosis not present

## 2014-10-21 DIAGNOSIS — M2142 Flat foot [pes planus] (acquired), left foot: Secondary | ICD-10-CM | POA: Diagnosis not present

## 2014-10-22 DIAGNOSIS — R262 Difficulty in walking, not elsewhere classified: Secondary | ICD-10-CM | POA: Diagnosis not present

## 2014-10-22 DIAGNOSIS — M79671 Pain in right foot: Secondary | ICD-10-CM | POA: Diagnosis not present

## 2014-10-22 DIAGNOSIS — M2142 Flat foot [pes planus] (acquired), left foot: Secondary | ICD-10-CM | POA: Diagnosis not present

## 2014-10-29 DIAGNOSIS — R262 Difficulty in walking, not elsewhere classified: Secondary | ICD-10-CM | POA: Diagnosis not present

## 2014-10-29 DIAGNOSIS — M2142 Flat foot [pes planus] (acquired), left foot: Secondary | ICD-10-CM | POA: Diagnosis not present

## 2014-10-29 DIAGNOSIS — M79671 Pain in right foot: Secondary | ICD-10-CM | POA: Diagnosis not present

## 2014-10-30 DIAGNOSIS — C44319 Basal cell carcinoma of skin of other parts of face: Secondary | ICD-10-CM | POA: Diagnosis not present

## 2014-10-30 DIAGNOSIS — D224 Melanocytic nevi of scalp and neck: Secondary | ICD-10-CM | POA: Diagnosis not present

## 2014-10-30 DIAGNOSIS — D2239 Melanocytic nevi of other parts of face: Secondary | ICD-10-CM | POA: Diagnosis not present

## 2014-10-30 DIAGNOSIS — L820 Inflamed seborrheic keratosis: Secondary | ICD-10-CM | POA: Diagnosis not present

## 2014-10-30 DIAGNOSIS — L821 Other seborrheic keratosis: Secondary | ICD-10-CM | POA: Diagnosis not present

## 2014-11-03 DIAGNOSIS — M2142 Flat foot [pes planus] (acquired), left foot: Secondary | ICD-10-CM | POA: Diagnosis not present

## 2014-11-03 DIAGNOSIS — R262 Difficulty in walking, not elsewhere classified: Secondary | ICD-10-CM | POA: Diagnosis not present

## 2014-11-03 DIAGNOSIS — M79671 Pain in right foot: Secondary | ICD-10-CM | POA: Diagnosis not present

## 2014-11-13 DIAGNOSIS — M79671 Pain in right foot: Secondary | ICD-10-CM | POA: Diagnosis not present

## 2014-11-13 DIAGNOSIS — M2142 Flat foot [pes planus] (acquired), left foot: Secondary | ICD-10-CM | POA: Diagnosis not present

## 2014-11-13 DIAGNOSIS — R262 Difficulty in walking, not elsewhere classified: Secondary | ICD-10-CM | POA: Diagnosis not present

## 2014-11-17 DIAGNOSIS — R262 Difficulty in walking, not elsewhere classified: Secondary | ICD-10-CM | POA: Diagnosis not present

## 2014-11-17 DIAGNOSIS — M2142 Flat foot [pes planus] (acquired), left foot: Secondary | ICD-10-CM | POA: Diagnosis not present

## 2014-11-17 DIAGNOSIS — M79671 Pain in right foot: Secondary | ICD-10-CM | POA: Diagnosis not present

## 2014-11-19 DIAGNOSIS — M79671 Pain in right foot: Secondary | ICD-10-CM | POA: Diagnosis not present

## 2014-11-19 DIAGNOSIS — M2142 Flat foot [pes planus] (acquired), left foot: Secondary | ICD-10-CM | POA: Diagnosis not present

## 2014-11-19 DIAGNOSIS — R262 Difficulty in walking, not elsewhere classified: Secondary | ICD-10-CM | POA: Diagnosis not present

## 2014-11-24 DIAGNOSIS — R079 Chest pain, unspecified: Secondary | ICD-10-CM | POA: Diagnosis not present

## 2014-11-24 DIAGNOSIS — E039 Hypothyroidism, unspecified: Secondary | ICD-10-CM | POA: Diagnosis not present

## 2014-11-24 DIAGNOSIS — E780 Pure hypercholesterolemia: Secondary | ICD-10-CM | POA: Diagnosis not present

## 2014-11-24 DIAGNOSIS — I10 Essential (primary) hypertension: Secondary | ICD-10-CM | POA: Diagnosis not present

## 2014-11-25 DIAGNOSIS — R262 Difficulty in walking, not elsewhere classified: Secondary | ICD-10-CM | POA: Diagnosis not present

## 2014-11-25 DIAGNOSIS — M79671 Pain in right foot: Secondary | ICD-10-CM | POA: Diagnosis not present

## 2014-11-25 DIAGNOSIS — M2142 Flat foot [pes planus] (acquired), left foot: Secondary | ICD-10-CM | POA: Diagnosis not present

## 2014-11-26 DIAGNOSIS — D510 Vitamin B12 deficiency anemia due to intrinsic factor deficiency: Secondary | ICD-10-CM | POA: Diagnosis not present

## 2014-11-26 DIAGNOSIS — M2142 Flat foot [pes planus] (acquired), left foot: Secondary | ICD-10-CM | POA: Diagnosis not present

## 2014-11-26 DIAGNOSIS — M2140 Flat foot [pes planus] (acquired), unspecified foot: Secondary | ICD-10-CM | POA: Diagnosis not present

## 2014-11-26 DIAGNOSIS — M25572 Pain in left ankle and joints of left foot: Secondary | ICD-10-CM | POA: Diagnosis not present

## 2014-11-27 DIAGNOSIS — M79671 Pain in right foot: Secondary | ICD-10-CM | POA: Diagnosis not present

## 2014-11-27 DIAGNOSIS — R262 Difficulty in walking, not elsewhere classified: Secondary | ICD-10-CM | POA: Diagnosis not present

## 2014-11-27 DIAGNOSIS — M2142 Flat foot [pes planus] (acquired), left foot: Secondary | ICD-10-CM | POA: Diagnosis not present

## 2014-12-08 DIAGNOSIS — M79671 Pain in right foot: Secondary | ICD-10-CM | POA: Diagnosis not present

## 2014-12-08 DIAGNOSIS — R262 Difficulty in walking, not elsewhere classified: Secondary | ICD-10-CM | POA: Diagnosis not present

## 2014-12-08 DIAGNOSIS — M2142 Flat foot [pes planus] (acquired), left foot: Secondary | ICD-10-CM | POA: Diagnosis not present

## 2014-12-12 DIAGNOSIS — R079 Chest pain, unspecified: Secondary | ICD-10-CM | POA: Diagnosis not present

## 2014-12-12 DIAGNOSIS — E780 Pure hypercholesterolemia: Secondary | ICD-10-CM | POA: Diagnosis not present

## 2014-12-12 DIAGNOSIS — I10 Essential (primary) hypertension: Secondary | ICD-10-CM | POA: Diagnosis not present

## 2014-12-12 DIAGNOSIS — E039 Hypothyroidism, unspecified: Secondary | ICD-10-CM | POA: Diagnosis not present

## 2014-12-16 DIAGNOSIS — L820 Inflamed seborrheic keratosis: Secondary | ICD-10-CM | POA: Diagnosis not present

## 2014-12-16 DIAGNOSIS — D485 Neoplasm of uncertain behavior of skin: Secondary | ICD-10-CM | POA: Diagnosis not present

## 2014-12-16 DIAGNOSIS — C44519 Basal cell carcinoma of skin of other part of trunk: Secondary | ICD-10-CM | POA: Diagnosis not present

## 2014-12-16 DIAGNOSIS — L299 Pruritus, unspecified: Secondary | ICD-10-CM | POA: Diagnosis not present

## 2014-12-18 DIAGNOSIS — Z9009 Acquired absence of other part of head and neck: Secondary | ICD-10-CM | POA: Diagnosis not present

## 2014-12-18 DIAGNOSIS — Z9889 Other specified postprocedural states: Secondary | ICD-10-CM | POA: Diagnosis not present

## 2014-12-18 DIAGNOSIS — Z888 Allergy status to other drugs, medicaments and biological substances status: Secondary | ICD-10-CM | POA: Diagnosis not present

## 2014-12-18 DIAGNOSIS — C73 Malignant neoplasm of thyroid gland: Secondary | ICD-10-CM | POA: Diagnosis not present

## 2014-12-18 DIAGNOSIS — Z923 Personal history of irradiation: Secondary | ICD-10-CM | POA: Diagnosis not present

## 2015-01-07 DIAGNOSIS — M25572 Pain in left ankle and joints of left foot: Secondary | ICD-10-CM | POA: Diagnosis not present

## 2015-01-07 DIAGNOSIS — M2142 Flat foot [pes planus] (acquired), left foot: Secondary | ICD-10-CM | POA: Diagnosis not present

## 2015-01-08 DIAGNOSIS — D510 Vitamin B12 deficiency anemia due to intrinsic factor deficiency: Secondary | ICD-10-CM | POA: Diagnosis not present

## 2015-03-09 DIAGNOSIS — M2142 Flat foot [pes planus] (acquired), left foot: Secondary | ICD-10-CM | POA: Diagnosis not present

## 2015-03-09 DIAGNOSIS — M25572 Pain in left ankle and joints of left foot: Secondary | ICD-10-CM | POA: Diagnosis not present

## 2015-03-11 DIAGNOSIS — D510 Vitamin B12 deficiency anemia due to intrinsic factor deficiency: Secondary | ICD-10-CM | POA: Diagnosis not present

## 2015-03-31 DIAGNOSIS — Z23 Encounter for immunization: Secondary | ICD-10-CM | POA: Diagnosis not present

## 2015-04-10 DIAGNOSIS — Z1231 Encounter for screening mammogram for malignant neoplasm of breast: Secondary | ICD-10-CM | POA: Diagnosis not present

## 2015-04-21 DIAGNOSIS — H25813 Combined forms of age-related cataract, bilateral: Secondary | ICD-10-CM | POA: Diagnosis not present

## 2015-04-29 DIAGNOSIS — H2511 Age-related nuclear cataract, right eye: Secondary | ICD-10-CM | POA: Diagnosis not present

## 2015-05-01 DIAGNOSIS — Z0000 Encounter for general adult medical examination without abnormal findings: Secondary | ICD-10-CM | POA: Diagnosis not present

## 2015-05-01 DIAGNOSIS — E039 Hypothyroidism, unspecified: Secondary | ICD-10-CM | POA: Diagnosis not present

## 2015-05-01 DIAGNOSIS — D510 Vitamin B12 deficiency anemia due to intrinsic factor deficiency: Secondary | ICD-10-CM | POA: Diagnosis not present

## 2015-05-01 DIAGNOSIS — Z79899 Other long term (current) drug therapy: Secondary | ICD-10-CM | POA: Diagnosis not present

## 2015-05-06 DIAGNOSIS — H25811 Combined forms of age-related cataract, right eye: Secondary | ICD-10-CM | POA: Diagnosis not present

## 2015-05-06 DIAGNOSIS — H2511 Age-related nuclear cataract, right eye: Secondary | ICD-10-CM | POA: Diagnosis not present

## 2015-05-14 DIAGNOSIS — F419 Anxiety disorder, unspecified: Secondary | ICD-10-CM | POA: Diagnosis not present

## 2015-05-14 DIAGNOSIS — I872 Venous insufficiency (chronic) (peripheral): Secondary | ICD-10-CM | POA: Diagnosis not present

## 2015-05-14 DIAGNOSIS — M859 Disorder of bone density and structure, unspecified: Secondary | ICD-10-CM | POA: Diagnosis not present

## 2015-05-14 DIAGNOSIS — D510 Vitamin B12 deficiency anemia due to intrinsic factor deficiency: Secondary | ICD-10-CM | POA: Diagnosis not present

## 2015-05-14 DIAGNOSIS — Z1389 Encounter for screening for other disorder: Secondary | ICD-10-CM | POA: Diagnosis not present

## 2015-05-14 DIAGNOSIS — M8580 Other specified disorders of bone density and structure, unspecified site: Secondary | ICD-10-CM | POA: Diagnosis not present

## 2015-05-14 DIAGNOSIS — Z13820 Encounter for screening for osteoporosis: Secondary | ICD-10-CM | POA: Diagnosis not present

## 2015-05-15 DIAGNOSIS — H2512 Age-related nuclear cataract, left eye: Secondary | ICD-10-CM | POA: Diagnosis not present

## 2015-05-27 DIAGNOSIS — H25812 Combined forms of age-related cataract, left eye: Secondary | ICD-10-CM | POA: Diagnosis not present

## 2015-05-27 DIAGNOSIS — H2512 Age-related nuclear cataract, left eye: Secondary | ICD-10-CM | POA: Diagnosis not present

## 2015-06-05 DIAGNOSIS — D510 Vitamin B12 deficiency anemia due to intrinsic factor deficiency: Secondary | ICD-10-CM | POA: Diagnosis not present

## 2015-06-25 DIAGNOSIS — H903 Sensorineural hearing loss, bilateral: Secondary | ICD-10-CM | POA: Diagnosis not present

## 2015-09-21 DIAGNOSIS — D510 Vitamin B12 deficiency anemia due to intrinsic factor deficiency: Secondary | ICD-10-CM | POA: Diagnosis not present

## 2015-11-05 DIAGNOSIS — M1711 Unilateral primary osteoarthritis, right knee: Secondary | ICD-10-CM | POA: Diagnosis not present

## 2015-11-25 DIAGNOSIS — D510 Vitamin B12 deficiency anemia due to intrinsic factor deficiency: Secondary | ICD-10-CM | POA: Diagnosis not present

## 2015-12-24 DIAGNOSIS — C73 Malignant neoplasm of thyroid gland: Secondary | ICD-10-CM | POA: Diagnosis not present

## 2015-12-24 DIAGNOSIS — Z9009 Acquired absence of other part of head and neck: Secondary | ICD-10-CM | POA: Diagnosis not present

## 2015-12-24 DIAGNOSIS — Z85850 Personal history of malignant neoplasm of thyroid: Secondary | ICD-10-CM | POA: Diagnosis not present

## 2015-12-24 DIAGNOSIS — Z886 Allergy status to analgesic agent status: Secondary | ICD-10-CM | POA: Diagnosis not present

## 2015-12-24 DIAGNOSIS — Z08 Encounter for follow-up examination after completed treatment for malignant neoplasm: Secondary | ICD-10-CM | POA: Diagnosis not present

## 2015-12-24 DIAGNOSIS — Z9889 Other specified postprocedural states: Secondary | ICD-10-CM | POA: Diagnosis not present

## 2015-12-24 DIAGNOSIS — Z888 Allergy status to other drugs, medicaments and biological substances status: Secondary | ICD-10-CM | POA: Diagnosis not present

## 2016-01-12 DIAGNOSIS — H04123 Dry eye syndrome of bilateral lacrimal glands: Secondary | ICD-10-CM | POA: Diagnosis not present

## 2016-01-20 DIAGNOSIS — D510 Vitamin B12 deficiency anemia due to intrinsic factor deficiency: Secondary | ICD-10-CM | POA: Diagnosis not present

## 2016-01-29 ENCOUNTER — Other Ambulatory Visit: Payer: Self-pay

## 2016-02-03 DIAGNOSIS — Z79899 Other long term (current) drug therapy: Secondary | ICD-10-CM | POA: Diagnosis not present

## 2016-02-15 DIAGNOSIS — R351 Nocturia: Secondary | ICD-10-CM | POA: Diagnosis not present

## 2016-02-15 DIAGNOSIS — G4700 Insomnia, unspecified: Secondary | ICD-10-CM | POA: Diagnosis not present

## 2016-02-15 DIAGNOSIS — R600 Localized edema: Secondary | ICD-10-CM | POA: Diagnosis not present

## 2016-02-18 DIAGNOSIS — Z0000 Encounter for general adult medical examination without abnormal findings: Secondary | ICD-10-CM | POA: Diagnosis not present

## 2016-02-18 DIAGNOSIS — R195 Other fecal abnormalities: Secondary | ICD-10-CM | POA: Diagnosis not present

## 2016-02-18 DIAGNOSIS — I872 Venous insufficiency (chronic) (peripheral): Secondary | ICD-10-CM | POA: Diagnosis not present

## 2016-02-18 DIAGNOSIS — D510 Vitamin B12 deficiency anemia due to intrinsic factor deficiency: Secondary | ICD-10-CM | POA: Diagnosis not present

## 2016-02-18 DIAGNOSIS — D649 Anemia, unspecified: Secondary | ICD-10-CM | POA: Diagnosis not present

## 2016-02-23 DIAGNOSIS — R195 Other fecal abnormalities: Secondary | ICD-10-CM | POA: Diagnosis not present

## 2016-02-23 DIAGNOSIS — D500 Iron deficiency anemia secondary to blood loss (chronic): Secondary | ICD-10-CM | POA: Diagnosis not present

## 2016-02-24 DIAGNOSIS — Z791 Long term (current) use of non-steroidal anti-inflammatories (NSAID): Secondary | ICD-10-CM | POA: Diagnosis not present

## 2016-02-24 DIAGNOSIS — R195 Other fecal abnormalities: Secondary | ICD-10-CM | POA: Diagnosis not present

## 2016-02-24 DIAGNOSIS — K449 Diaphragmatic hernia without obstruction or gangrene: Secondary | ICD-10-CM | POA: Diagnosis not present

## 2016-02-24 DIAGNOSIS — K317 Polyp of stomach and duodenum: Secondary | ICD-10-CM | POA: Diagnosis not present

## 2016-02-24 DIAGNOSIS — D500 Iron deficiency anemia secondary to blood loss (chronic): Secondary | ICD-10-CM | POA: Diagnosis not present

## 2016-02-26 DIAGNOSIS — E876 Hypokalemia: Secondary | ICD-10-CM | POA: Diagnosis not present

## 2016-03-18 DIAGNOSIS — D500 Iron deficiency anemia secondary to blood loss (chronic): Secondary | ICD-10-CM | POA: Diagnosis not present

## 2016-03-24 DIAGNOSIS — D500 Iron deficiency anemia secondary to blood loss (chronic): Secondary | ICD-10-CM | POA: Diagnosis not present

## 2016-03-24 DIAGNOSIS — R195 Other fecal abnormalities: Secondary | ICD-10-CM | POA: Diagnosis not present

## 2016-03-28 ENCOUNTER — Other Ambulatory Visit: Payer: Self-pay | Admitting: Unknown Physician Specialty

## 2016-03-28 DIAGNOSIS — D500 Iron deficiency anemia secondary to blood loss (chronic): Secondary | ICD-10-CM

## 2016-04-01 DIAGNOSIS — D510 Vitamin B12 deficiency anemia due to intrinsic factor deficiency: Secondary | ICD-10-CM | POA: Diagnosis not present

## 2016-04-01 DIAGNOSIS — Z23 Encounter for immunization: Secondary | ICD-10-CM | POA: Diagnosis not present

## 2016-04-07 DIAGNOSIS — L97921 Non-pressure chronic ulcer of unspecified part of left lower leg limited to breakdown of skin: Secondary | ICD-10-CM | POA: Diagnosis not present

## 2016-04-07 DIAGNOSIS — I8310 Varicose veins of unspecified lower extremity with inflammation: Secondary | ICD-10-CM | POA: Diagnosis not present

## 2016-04-07 DIAGNOSIS — L290 Pruritus ani: Secondary | ICD-10-CM | POA: Diagnosis not present

## 2016-04-14 DIAGNOSIS — I8310 Varicose veins of unspecified lower extremity with inflammation: Secondary | ICD-10-CM | POA: Diagnosis not present

## 2016-04-19 DIAGNOSIS — D500 Iron deficiency anemia secondary to blood loss (chronic): Secondary | ICD-10-CM | POA: Diagnosis not present

## 2016-04-26 DIAGNOSIS — R195 Other fecal abnormalities: Secondary | ICD-10-CM | POA: Diagnosis not present

## 2016-04-26 DIAGNOSIS — D500 Iron deficiency anemia secondary to blood loss (chronic): Secondary | ICD-10-CM | POA: Diagnosis not present

## 2016-04-28 DIAGNOSIS — M7752 Other enthesopathy of left foot: Secondary | ICD-10-CM | POA: Diagnosis not present

## 2016-04-28 DIAGNOSIS — M6702 Short Achilles tendon (acquired), left ankle: Secondary | ICD-10-CM | POA: Diagnosis not present

## 2016-04-28 DIAGNOSIS — M7662 Achilles tendinitis, left leg: Secondary | ICD-10-CM | POA: Diagnosis not present

## 2016-05-06 ENCOUNTER — Ambulatory Visit
Admission: RE | Admit: 2016-05-06 | Discharge: 2016-05-06 | Disposition: A | Discharge status: Home / Self Care | Payer: Medicare Other | Source: Ambulatory Visit | Attending: Unknown Physician Specialty | Admitting: Unknown Physician Specialty

## 2016-05-06 ENCOUNTER — Inpatient Hospital Stay: Admission: RE | Admit: 2016-05-06 | Payer: Medicare Other | Source: Ambulatory Visit

## 2016-05-06 DIAGNOSIS — K449 Diaphragmatic hernia without obstruction or gangrene: Secondary | ICD-10-CM | POA: Diagnosis not present

## 2016-05-06 DIAGNOSIS — D500 Iron deficiency anemia secondary to blood loss (chronic): Secondary | ICD-10-CM

## 2016-05-06 DIAGNOSIS — K5730 Diverticulosis of large intestine without perforation or abscess without bleeding: Secondary | ICD-10-CM | POA: Diagnosis not present

## 2016-08-16 DIAGNOSIS — D500 Iron deficiency anemia secondary to blood loss (chronic): Secondary | ICD-10-CM | POA: Diagnosis not present

## 2016-08-16 DIAGNOSIS — R195 Other fecal abnormalities: Secondary | ICD-10-CM | POA: Diagnosis not present

## 2016-08-25 DIAGNOSIS — D500 Iron deficiency anemia secondary to blood loss (chronic): Secondary | ICD-10-CM | POA: Diagnosis not present

## 2016-08-25 DIAGNOSIS — R195 Other fecal abnormalities: Secondary | ICD-10-CM | POA: Diagnosis not present

## 2016-08-25 DIAGNOSIS — K635 Polyp of colon: Secondary | ICD-10-CM | POA: Diagnosis not present

## 2016-09-08 DIAGNOSIS — M1712 Unilateral primary osteoarthritis, left knee: Secondary | ICD-10-CM | POA: Diagnosis not present

## 2016-10-20 DIAGNOSIS — M1712 Unilateral primary osteoarthritis, left knee: Secondary | ICD-10-CM | POA: Diagnosis not present

## 2016-11-10 DIAGNOSIS — D510 Vitamin B12 deficiency anemia due to intrinsic factor deficiency: Secondary | ICD-10-CM | POA: Diagnosis not present

## 2016-11-16 DIAGNOSIS — I87312 Chronic venous hypertension (idiopathic) with ulcer of left lower extremity: Secondary | ICD-10-CM | POA: Diagnosis not present

## 2016-11-16 DIAGNOSIS — D649 Anemia, unspecified: Secondary | ICD-10-CM | POA: Diagnosis not present

## 2016-11-16 DIAGNOSIS — I872 Venous insufficiency (chronic) (peripheral): Secondary | ICD-10-CM | POA: Diagnosis not present

## 2016-11-16 DIAGNOSIS — M1990 Unspecified osteoarthritis, unspecified site: Secondary | ICD-10-CM | POA: Diagnosis not present

## 2016-11-16 DIAGNOSIS — Z85850 Personal history of malignant neoplasm of thyroid: Secondary | ICD-10-CM | POA: Diagnosis not present

## 2016-11-16 DIAGNOSIS — I10 Essential (primary) hypertension: Secondary | ICD-10-CM | POA: Diagnosis not present

## 2016-11-16 DIAGNOSIS — L97822 Non-pressure chronic ulcer of other part of left lower leg with fat layer exposed: Secondary | ICD-10-CM | POA: Diagnosis not present

## 2016-11-24 DIAGNOSIS — I87312 Chronic venous hypertension (idiopathic) with ulcer of left lower extremity: Secondary | ICD-10-CM | POA: Diagnosis not present

## 2016-11-24 DIAGNOSIS — I872 Venous insufficiency (chronic) (peripheral): Secondary | ICD-10-CM | POA: Diagnosis not present

## 2016-11-24 DIAGNOSIS — L97822 Non-pressure chronic ulcer of other part of left lower leg with fat layer exposed: Secondary | ICD-10-CM | POA: Diagnosis not present

## 2016-12-01 DIAGNOSIS — L97822 Non-pressure chronic ulcer of other part of left lower leg with fat layer exposed: Secondary | ICD-10-CM | POA: Diagnosis not present

## 2016-12-01 DIAGNOSIS — I872 Venous insufficiency (chronic) (peripheral): Secondary | ICD-10-CM | POA: Diagnosis not present

## 2016-12-08 DIAGNOSIS — L97822 Non-pressure chronic ulcer of other part of left lower leg with fat layer exposed: Secondary | ICD-10-CM | POA: Diagnosis not present

## 2016-12-08 DIAGNOSIS — I87312 Chronic venous hypertension (idiopathic) with ulcer of left lower extremity: Secondary | ICD-10-CM | POA: Diagnosis not present

## 2016-12-08 DIAGNOSIS — I87332 Chronic venous hypertension (idiopathic) with ulcer and inflammation of left lower extremity: Secondary | ICD-10-CM | POA: Diagnosis not present

## 2016-12-12 DIAGNOSIS — M1712 Unilateral primary osteoarthritis, left knee: Secondary | ICD-10-CM | POA: Diagnosis not present

## 2016-12-14 DIAGNOSIS — D510 Vitamin B12 deficiency anemia due to intrinsic factor deficiency: Secondary | ICD-10-CM | POA: Diagnosis not present

## 2016-12-16 DIAGNOSIS — L97909 Non-pressure chronic ulcer of unspecified part of unspecified lower leg with unspecified severity: Secondary | ICD-10-CM | POA: Diagnosis not present

## 2016-12-16 DIAGNOSIS — I87312 Chronic venous hypertension (idiopathic) with ulcer of left lower extremity: Secondary | ICD-10-CM | POA: Diagnosis not present

## 2016-12-19 DIAGNOSIS — I87312 Chronic venous hypertension (idiopathic) with ulcer of left lower extremity: Secondary | ICD-10-CM | POA: Diagnosis not present

## 2016-12-19 DIAGNOSIS — L97822 Non-pressure chronic ulcer of other part of left lower leg with fat layer exposed: Secondary | ICD-10-CM | POA: Diagnosis not present

## 2016-12-21 DIAGNOSIS — D500 Iron deficiency anemia secondary to blood loss (chronic): Secondary | ICD-10-CM | POA: Diagnosis not present

## 2016-12-22 DIAGNOSIS — Z483 Aftercare following surgery for neoplasm: Secondary | ICD-10-CM | POA: Diagnosis not present

## 2016-12-22 DIAGNOSIS — Z85850 Personal history of malignant neoplasm of thyroid: Secondary | ICD-10-CM | POA: Diagnosis not present

## 2016-12-22 DIAGNOSIS — Z79890 Hormone replacement therapy (postmenopausal): Secondary | ICD-10-CM | POA: Diagnosis not present

## 2016-12-22 DIAGNOSIS — C73 Malignant neoplasm of thyroid gland: Secondary | ICD-10-CM | POA: Diagnosis not present

## 2016-12-22 DIAGNOSIS — Z79899 Other long term (current) drug therapy: Secondary | ICD-10-CM | POA: Diagnosis not present

## 2016-12-22 DIAGNOSIS — K143 Hypertrophy of tongue papillae: Secondary | ICD-10-CM | POA: Diagnosis not present

## 2016-12-26 DIAGNOSIS — L97822 Non-pressure chronic ulcer of other part of left lower leg with fat layer exposed: Secondary | ICD-10-CM | POA: Diagnosis not present

## 2016-12-26 DIAGNOSIS — I872 Venous insufficiency (chronic) (peripheral): Secondary | ICD-10-CM | POA: Diagnosis not present

## 2016-12-26 DIAGNOSIS — I87312 Chronic venous hypertension (idiopathic) with ulcer of left lower extremity: Secondary | ICD-10-CM | POA: Diagnosis not present

## 2016-12-29 DIAGNOSIS — K635 Polyp of colon: Secondary | ICD-10-CM | POA: Diagnosis not present

## 2016-12-29 DIAGNOSIS — D500 Iron deficiency anemia secondary to blood loss (chronic): Secondary | ICD-10-CM | POA: Diagnosis not present

## 2016-12-30 DIAGNOSIS — Z1231 Encounter for screening mammogram for malignant neoplasm of breast: Secondary | ICD-10-CM | POA: Diagnosis not present

## 2017-01-02 DIAGNOSIS — I87312 Chronic venous hypertension (idiopathic) with ulcer of left lower extremity: Secondary | ICD-10-CM | POA: Diagnosis not present

## 2017-01-02 DIAGNOSIS — L97822 Non-pressure chronic ulcer of other part of left lower leg with fat layer exposed: Secondary | ICD-10-CM | POA: Diagnosis not present

## 2017-01-02 DIAGNOSIS — I872 Venous insufficiency (chronic) (peripheral): Secondary | ICD-10-CM | POA: Diagnosis not present

## 2017-01-09 DIAGNOSIS — L97822 Non-pressure chronic ulcer of other part of left lower leg with fat layer exposed: Secondary | ICD-10-CM | POA: Diagnosis not present

## 2017-01-09 DIAGNOSIS — I87312 Chronic venous hypertension (idiopathic) with ulcer of left lower extremity: Secondary | ICD-10-CM | POA: Diagnosis not present

## 2017-01-09 DIAGNOSIS — I872 Venous insufficiency (chronic) (peripheral): Secondary | ICD-10-CM | POA: Diagnosis not present

## 2017-01-10 DIAGNOSIS — H524 Presbyopia: Secondary | ICD-10-CM | POA: Diagnosis not present

## 2017-01-10 DIAGNOSIS — H04123 Dry eye syndrome of bilateral lacrimal glands: Secondary | ICD-10-CM | POA: Diagnosis not present

## 2017-01-16 DIAGNOSIS — I87312 Chronic venous hypertension (idiopathic) with ulcer of left lower extremity: Secondary | ICD-10-CM | POA: Diagnosis not present

## 2017-01-16 DIAGNOSIS — D510 Vitamin B12 deficiency anemia due to intrinsic factor deficiency: Secondary | ICD-10-CM | POA: Diagnosis not present

## 2017-01-16 DIAGNOSIS — I872 Venous insufficiency (chronic) (peripheral): Secondary | ICD-10-CM | POA: Diagnosis not present

## 2017-01-16 DIAGNOSIS — L97822 Non-pressure chronic ulcer of other part of left lower leg with fat layer exposed: Secondary | ICD-10-CM | POA: Diagnosis not present

## 2017-01-23 DIAGNOSIS — I87312 Chronic venous hypertension (idiopathic) with ulcer of left lower extremity: Secondary | ICD-10-CM | POA: Diagnosis not present

## 2017-01-23 DIAGNOSIS — L97822 Non-pressure chronic ulcer of other part of left lower leg with fat layer exposed: Secondary | ICD-10-CM | POA: Diagnosis not present

## 2017-01-23 DIAGNOSIS — I872 Venous insufficiency (chronic) (peripheral): Secondary | ICD-10-CM | POA: Diagnosis not present

## 2017-01-24 DIAGNOSIS — M7989 Other specified soft tissue disorders: Secondary | ICD-10-CM | POA: Diagnosis not present

## 2017-01-24 DIAGNOSIS — I87312 Chronic venous hypertension (idiopathic) with ulcer of left lower extremity: Secondary | ICD-10-CM | POA: Diagnosis not present

## 2017-01-31 DIAGNOSIS — L97822 Non-pressure chronic ulcer of other part of left lower leg with fat layer exposed: Secondary | ICD-10-CM | POA: Diagnosis not present

## 2017-01-31 DIAGNOSIS — I87312 Chronic venous hypertension (idiopathic) with ulcer of left lower extremity: Secondary | ICD-10-CM | POA: Diagnosis not present

## 2017-02-07 DIAGNOSIS — I87312 Chronic venous hypertension (idiopathic) with ulcer of left lower extremity: Secondary | ICD-10-CM | POA: Diagnosis not present

## 2017-02-07 DIAGNOSIS — L97322 Non-pressure chronic ulcer of left ankle with fat layer exposed: Secondary | ICD-10-CM | POA: Diagnosis not present

## 2017-02-07 DIAGNOSIS — L97822 Non-pressure chronic ulcer of other part of left lower leg with fat layer exposed: Secondary | ICD-10-CM | POA: Diagnosis not present

## 2017-02-14 DIAGNOSIS — I87312 Chronic venous hypertension (idiopathic) with ulcer of left lower extremity: Secondary | ICD-10-CM | POA: Diagnosis not present

## 2017-02-14 DIAGNOSIS — L97822 Non-pressure chronic ulcer of other part of left lower leg with fat layer exposed: Secondary | ICD-10-CM | POA: Diagnosis not present

## 2017-02-14 DIAGNOSIS — L97322 Non-pressure chronic ulcer of left ankle with fat layer exposed: Secondary | ICD-10-CM | POA: Diagnosis not present

## 2017-02-28 DIAGNOSIS — I872 Venous insufficiency (chronic) (peripheral): Secondary | ICD-10-CM | POA: Diagnosis not present

## 2017-02-28 DIAGNOSIS — L97822 Non-pressure chronic ulcer of other part of left lower leg with fat layer exposed: Secondary | ICD-10-CM | POA: Diagnosis not present

## 2017-02-28 DIAGNOSIS — L97322 Non-pressure chronic ulcer of left ankle with fat layer exposed: Secondary | ICD-10-CM | POA: Diagnosis not present

## 2017-03-08 DIAGNOSIS — D510 Vitamin B12 deficiency anemia due to intrinsic factor deficiency: Secondary | ICD-10-CM | POA: Diagnosis not present

## 2017-03-08 DIAGNOSIS — Z23 Encounter for immunization: Secondary | ICD-10-CM | POA: Diagnosis not present

## 2017-03-14 DIAGNOSIS — I83023 Varicose veins of left lower extremity with ulcer of ankle: Secondary | ICD-10-CM | POA: Diagnosis not present

## 2017-03-14 DIAGNOSIS — I83022 Varicose veins of left lower extremity with ulcer of calf: Secondary | ICD-10-CM | POA: Diagnosis not present

## 2017-03-21 DIAGNOSIS — I87312 Chronic venous hypertension (idiopathic) with ulcer of left lower extremity: Secondary | ICD-10-CM | POA: Diagnosis not present

## 2017-03-21 DIAGNOSIS — L97322 Non-pressure chronic ulcer of left ankle with fat layer exposed: Secondary | ICD-10-CM | POA: Diagnosis not present

## 2017-03-21 DIAGNOSIS — I872 Venous insufficiency (chronic) (peripheral): Secondary | ICD-10-CM | POA: Diagnosis not present

## 2017-03-21 DIAGNOSIS — L97822 Non-pressure chronic ulcer of other part of left lower leg with fat layer exposed: Secondary | ICD-10-CM | POA: Diagnosis not present

## 2017-03-28 DIAGNOSIS — L97322 Non-pressure chronic ulcer of left ankle with fat layer exposed: Secondary | ICD-10-CM | POA: Diagnosis not present

## 2017-03-28 DIAGNOSIS — I87312 Chronic venous hypertension (idiopathic) with ulcer of left lower extremity: Secondary | ICD-10-CM | POA: Diagnosis not present

## 2017-03-28 DIAGNOSIS — L97822 Non-pressure chronic ulcer of other part of left lower leg with fat layer exposed: Secondary | ICD-10-CM | POA: Diagnosis not present

## 2017-04-04 DIAGNOSIS — L97322 Non-pressure chronic ulcer of left ankle with fat layer exposed: Secondary | ICD-10-CM | POA: Diagnosis not present

## 2017-04-04 DIAGNOSIS — L97822 Non-pressure chronic ulcer of other part of left lower leg with fat layer exposed: Secondary | ICD-10-CM | POA: Diagnosis not present

## 2017-04-04 DIAGNOSIS — I87312 Chronic venous hypertension (idiopathic) with ulcer of left lower extremity: Secondary | ICD-10-CM | POA: Diagnosis not present

## 2017-04-07 DIAGNOSIS — I83023 Varicose veins of left lower extremity with ulcer of ankle: Secondary | ICD-10-CM | POA: Diagnosis not present

## 2017-04-07 DIAGNOSIS — M7989 Other specified soft tissue disorders: Secondary | ICD-10-CM | POA: Diagnosis not present

## 2017-04-10 DIAGNOSIS — L97322 Non-pressure chronic ulcer of left ankle with fat layer exposed: Secondary | ICD-10-CM | POA: Diagnosis not present

## 2017-04-10 DIAGNOSIS — I87312 Chronic venous hypertension (idiopathic) with ulcer of left lower extremity: Secondary | ICD-10-CM | POA: Diagnosis not present

## 2017-04-10 DIAGNOSIS — L97822 Non-pressure chronic ulcer of other part of left lower leg with fat layer exposed: Secondary | ICD-10-CM | POA: Diagnosis not present

## 2017-04-17 DIAGNOSIS — I87312 Chronic venous hypertension (idiopathic) with ulcer of left lower extremity: Secondary | ICD-10-CM | POA: Diagnosis not present

## 2017-04-17 DIAGNOSIS — D510 Vitamin B12 deficiency anemia due to intrinsic factor deficiency: Secondary | ICD-10-CM | POA: Diagnosis not present

## 2017-04-17 DIAGNOSIS — L97822 Non-pressure chronic ulcer of other part of left lower leg with fat layer exposed: Secondary | ICD-10-CM | POA: Diagnosis not present

## 2017-04-17 DIAGNOSIS — L97321 Non-pressure chronic ulcer of left ankle limited to breakdown of skin: Secondary | ICD-10-CM | POA: Diagnosis not present

## 2017-04-17 DIAGNOSIS — D519 Vitamin B12 deficiency anemia, unspecified: Secondary | ICD-10-CM | POA: Diagnosis not present

## 2017-04-17 DIAGNOSIS — L97322 Non-pressure chronic ulcer of left ankle with fat layer exposed: Secondary | ICD-10-CM | POA: Diagnosis not present

## 2017-04-24 DIAGNOSIS — I87312 Chronic venous hypertension (idiopathic) with ulcer of left lower extremity: Secondary | ICD-10-CM | POA: Diagnosis not present

## 2017-04-24 DIAGNOSIS — L97322 Non-pressure chronic ulcer of left ankle with fat layer exposed: Secondary | ICD-10-CM | POA: Diagnosis not present

## 2017-04-24 DIAGNOSIS — D500 Iron deficiency anemia secondary to blood loss (chronic): Secondary | ICD-10-CM | POA: Diagnosis not present

## 2017-04-27 DIAGNOSIS — D500 Iron deficiency anemia secondary to blood loss (chronic): Secondary | ICD-10-CM | POA: Diagnosis not present

## 2017-04-27 DIAGNOSIS — K635 Polyp of colon: Secondary | ICD-10-CM | POA: Diagnosis not present

## 2017-05-01 DIAGNOSIS — L97329 Non-pressure chronic ulcer of left ankle with unspecified severity: Secondary | ICD-10-CM | POA: Diagnosis not present

## 2017-05-01 DIAGNOSIS — Z09 Encounter for follow-up examination after completed treatment for conditions other than malignant neoplasm: Secondary | ICD-10-CM | POA: Diagnosis not present

## 2017-05-26 DIAGNOSIS — Z1339 Encounter for screening examination for other mental health and behavioral disorders: Secondary | ICD-10-CM | POA: Diagnosis not present

## 2017-05-26 DIAGNOSIS — Z79899 Other long term (current) drug therapy: Secondary | ICD-10-CM | POA: Diagnosis not present

## 2017-05-26 DIAGNOSIS — E785 Hyperlipidemia, unspecified: Secondary | ICD-10-CM | POA: Diagnosis not present

## 2017-05-26 DIAGNOSIS — Z0000 Encounter for general adult medical examination without abnormal findings: Secondary | ICD-10-CM | POA: Diagnosis not present

## 2017-05-26 DIAGNOSIS — D510 Vitamin B12 deficiency anemia due to intrinsic factor deficiency: Secondary | ICD-10-CM | POA: Diagnosis not present

## 2017-05-26 DIAGNOSIS — E7800 Pure hypercholesterolemia, unspecified: Secondary | ICD-10-CM | POA: Diagnosis not present

## 2017-05-26 DIAGNOSIS — M8580 Other specified disorders of bone density and structure, unspecified site: Secondary | ICD-10-CM | POA: Diagnosis not present

## 2017-05-26 DIAGNOSIS — Z9181 History of falling: Secondary | ICD-10-CM | POA: Diagnosis not present

## 2017-05-26 DIAGNOSIS — E559 Vitamin D deficiency, unspecified: Secondary | ICD-10-CM | POA: Diagnosis not present

## 2017-06-27 DIAGNOSIS — D510 Vitamin B12 deficiency anemia due to intrinsic factor deficiency: Secondary | ICD-10-CM | POA: Diagnosis not present

## 2017-07-24 DIAGNOSIS — L97921 Non-pressure chronic ulcer of unspecified part of left lower leg limited to breakdown of skin: Secondary | ICD-10-CM | POA: Diagnosis not present

## 2017-07-24 DIAGNOSIS — I83023 Varicose veins of left lower extremity with ulcer of ankle: Secondary | ICD-10-CM | POA: Diagnosis not present

## 2017-08-09 DIAGNOSIS — D510 Vitamin B12 deficiency anemia due to intrinsic factor deficiency: Secondary | ICD-10-CM | POA: Diagnosis not present

## 2017-08-18 DIAGNOSIS — D500 Iron deficiency anemia secondary to blood loss (chronic): Secondary | ICD-10-CM | POA: Diagnosis not present

## 2017-08-24 DIAGNOSIS — R195 Other fecal abnormalities: Secondary | ICD-10-CM | POA: Diagnosis not present

## 2017-08-24 DIAGNOSIS — D500 Iron deficiency anemia secondary to blood loss (chronic): Secondary | ICD-10-CM | POA: Diagnosis not present

## 2017-08-24 DIAGNOSIS — K635 Polyp of colon: Secondary | ICD-10-CM | POA: Diagnosis not present

## 2017-09-11 DIAGNOSIS — D510 Vitamin B12 deficiency anemia due to intrinsic factor deficiency: Secondary | ICD-10-CM | POA: Diagnosis not present

## 2017-09-12 DIAGNOSIS — M1712 Unilateral primary osteoarthritis, left knee: Secondary | ICD-10-CM | POA: Diagnosis not present

## 2017-10-31 DIAGNOSIS — D510 Vitamin B12 deficiency anemia due to intrinsic factor deficiency: Secondary | ICD-10-CM | POA: Diagnosis not present

## 2017-11-24 DIAGNOSIS — D510 Vitamin B12 deficiency anemia due to intrinsic factor deficiency: Secondary | ICD-10-CM | POA: Diagnosis not present

## 2017-12-07 DIAGNOSIS — C44529 Squamous cell carcinoma of skin of other part of trunk: Secondary | ICD-10-CM | POA: Diagnosis not present

## 2017-12-07 DIAGNOSIS — R233 Spontaneous ecchymoses: Secondary | ICD-10-CM | POA: Diagnosis not present

## 2017-12-07 DIAGNOSIS — D2239 Melanocytic nevi of other parts of face: Secondary | ICD-10-CM | POA: Diagnosis not present

## 2017-12-07 DIAGNOSIS — L821 Other seborrheic keratosis: Secondary | ICD-10-CM | POA: Diagnosis not present

## 2017-12-07 DIAGNOSIS — D485 Neoplasm of uncertain behavior of skin: Secondary | ICD-10-CM | POA: Diagnosis not present

## 2017-12-07 DIAGNOSIS — D225 Melanocytic nevi of trunk: Secondary | ICD-10-CM | POA: Diagnosis not present

## 2017-12-18 DIAGNOSIS — M1712 Unilateral primary osteoarthritis, left knee: Secondary | ICD-10-CM | POA: Diagnosis not present

## 2018-01-01 DIAGNOSIS — D510 Vitamin B12 deficiency anemia due to intrinsic factor deficiency: Secondary | ICD-10-CM | POA: Diagnosis not present

## 2018-01-12 DIAGNOSIS — H04123 Dry eye syndrome of bilateral lacrimal glands: Secondary | ICD-10-CM | POA: Diagnosis not present

## 2018-01-12 DIAGNOSIS — H524 Presbyopia: Secondary | ICD-10-CM | POA: Diagnosis not present

## 2018-01-15 DIAGNOSIS — D485 Neoplasm of uncertain behavior of skin: Secondary | ICD-10-CM | POA: Diagnosis not present

## 2018-02-15 DIAGNOSIS — D500 Iron deficiency anemia secondary to blood loss (chronic): Secondary | ICD-10-CM | POA: Diagnosis not present

## 2018-02-22 DIAGNOSIS — D500 Iron deficiency anemia secondary to blood loss (chronic): Secondary | ICD-10-CM | POA: Diagnosis not present

## 2018-02-22 DIAGNOSIS — R195 Other fecal abnormalities: Secondary | ICD-10-CM | POA: Diagnosis not present

## 2018-03-21 DIAGNOSIS — D510 Vitamin B12 deficiency anemia due to intrinsic factor deficiency: Secondary | ICD-10-CM | POA: Diagnosis not present

## 2018-03-21 DIAGNOSIS — Z23 Encounter for immunization: Secondary | ICD-10-CM | POA: Diagnosis not present

## 2018-03-27 DIAGNOSIS — Z1231 Encounter for screening mammogram for malignant neoplasm of breast: Secondary | ICD-10-CM | POA: Diagnosis not present

## 2018-03-27 DIAGNOSIS — M1712 Unilateral primary osteoarthritis, left knee: Secondary | ICD-10-CM | POA: Diagnosis not present

## 2018-03-28 DIAGNOSIS — M1712 Unilateral primary osteoarthritis, left knee: Secondary | ICD-10-CM | POA: Diagnosis not present

## 2018-03-28 DIAGNOSIS — M25562 Pain in left knee: Secondary | ICD-10-CM | POA: Diagnosis not present

## 2018-03-28 DIAGNOSIS — M7052 Other bursitis of knee, left knee: Secondary | ICD-10-CM | POA: Diagnosis not present

## 2018-03-29 DIAGNOSIS — M25511 Pain in right shoulder: Secondary | ICD-10-CM | POA: Diagnosis not present

## 2018-04-16 ENCOUNTER — Other Ambulatory Visit: Payer: Self-pay

## 2018-05-01 DIAGNOSIS — D510 Vitamin B12 deficiency anemia due to intrinsic factor deficiency: Secondary | ICD-10-CM | POA: Diagnosis not present

## 2018-05-10 DIAGNOSIS — Z79890 Hormone replacement therapy (postmenopausal): Secondary | ICD-10-CM | POA: Diagnosis not present

## 2018-05-10 DIAGNOSIS — Z08 Encounter for follow-up examination after completed treatment for malignant neoplasm: Secondary | ICD-10-CM | POA: Diagnosis not present

## 2018-05-10 DIAGNOSIS — Z85850 Personal history of malignant neoplasm of thyroid: Secondary | ICD-10-CM | POA: Diagnosis not present

## 2018-05-10 DIAGNOSIS — C73 Malignant neoplasm of thyroid gland: Secondary | ICD-10-CM | POA: Diagnosis not present

## 2018-05-10 DIAGNOSIS — Z923 Personal history of irradiation: Secondary | ICD-10-CM | POA: Diagnosis not present

## 2018-05-10 DIAGNOSIS — Z9089 Acquired absence of other organs: Secondary | ICD-10-CM | POA: Diagnosis not present

## 2018-05-28 DIAGNOSIS — D510 Vitamin B12 deficiency anemia due to intrinsic factor deficiency: Secondary | ICD-10-CM | POA: Diagnosis not present

## 2018-05-31 DIAGNOSIS — Z79899 Other long term (current) drug therapy: Secondary | ICD-10-CM | POA: Diagnosis not present

## 2018-05-31 DIAGNOSIS — Z0000 Encounter for general adult medical examination without abnormal findings: Secondary | ICD-10-CM | POA: Diagnosis not present

## 2018-05-31 DIAGNOSIS — Z6835 Body mass index (BMI) 35.0-35.9, adult: Secondary | ICD-10-CM | POA: Diagnosis not present

## 2018-05-31 DIAGNOSIS — E785 Hyperlipidemia, unspecified: Secondary | ICD-10-CM | POA: Diagnosis not present

## 2018-05-31 DIAGNOSIS — E559 Vitamin D deficiency, unspecified: Secondary | ICD-10-CM | POA: Diagnosis not present

## 2018-07-09 DIAGNOSIS — D510 Vitamin B12 deficiency anemia due to intrinsic factor deficiency: Secondary | ICD-10-CM | POA: Diagnosis not present

## 2018-07-18 DIAGNOSIS — G894 Chronic pain syndrome: Secondary | ICD-10-CM | POA: Diagnosis not present

## 2018-07-18 DIAGNOSIS — M150 Primary generalized (osteo)arthritis: Secondary | ICD-10-CM | POA: Diagnosis not present

## 2018-07-18 DIAGNOSIS — M545 Low back pain: Secondary | ICD-10-CM | POA: Diagnosis not present

## 2018-07-18 DIAGNOSIS — Z79899 Other long term (current) drug therapy: Secondary | ICD-10-CM | POA: Diagnosis not present

## 2018-07-18 DIAGNOSIS — M461 Sacroiliitis, not elsewhere classified: Secondary | ICD-10-CM | POA: Diagnosis not present

## 2018-07-18 DIAGNOSIS — M5441 Lumbago with sciatica, right side: Secondary | ICD-10-CM | POA: Diagnosis not present

## 2018-07-18 DIAGNOSIS — E039 Hypothyroidism, unspecified: Secondary | ICD-10-CM | POA: Diagnosis not present

## 2018-07-18 DIAGNOSIS — F1120 Opioid dependence, uncomplicated: Secondary | ICD-10-CM | POA: Diagnosis not present

## 2018-08-10 IMAGING — CT CT VIRTUAL COLONOSCOPY DIAGNOSTIC
4 of 14 series · 11 of 36 positions shown, 15 images · non-contrast
Comparison: None.

CLINICAL DATA: Chronic blood loss, anemia for 3 months

EXAM:
CT VIRTUAL COLONOSCOPY DIAGNOSTIC
TECHNIQUE: The patient was given a standard bowel preparation with Gastrografin
and barium for fluid and stool tagging respectively. The quality of
the bowel preparation is excellent/moderate/poor. Automated CO2
insufflation of the colon was performed prior to image acquisition
and colonic distention is excellent/moderate/poor. Image post
processing was used to generate a 3D endoluminal fly-through
projection of the colon and to electronically subtract stool/fluid
as appropriate.

[Series 2: supine (id) · axial · 0.84mm/px · z∈[-314,-141]mm · 2 of 414 slices shown (1 of 2)]
[im 138/414  soft-tissue]
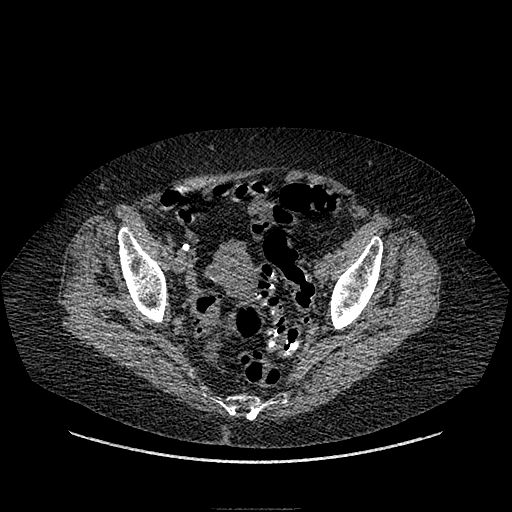
[im 276/414  soft-tissue]
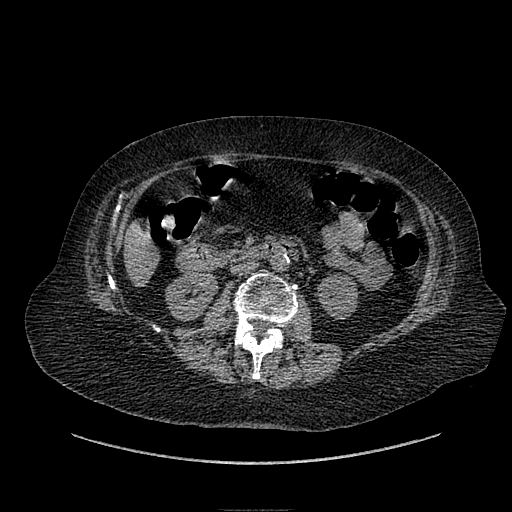

[Series 6: prone (id) · axial · 0.85mm/px · z∈[-409,-152]mm · 3 of 413 slices shown, 7 images]
[im 104/413  soft-tissue]
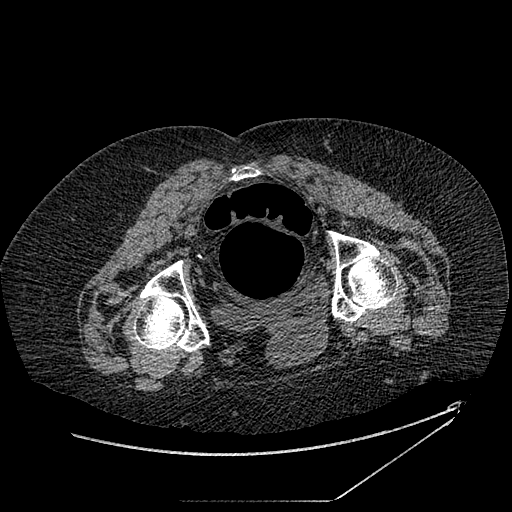
[im 104/413  lung]
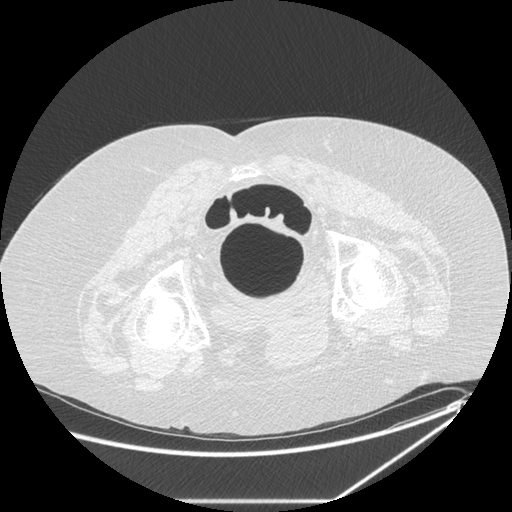
[im 104/413  bone]
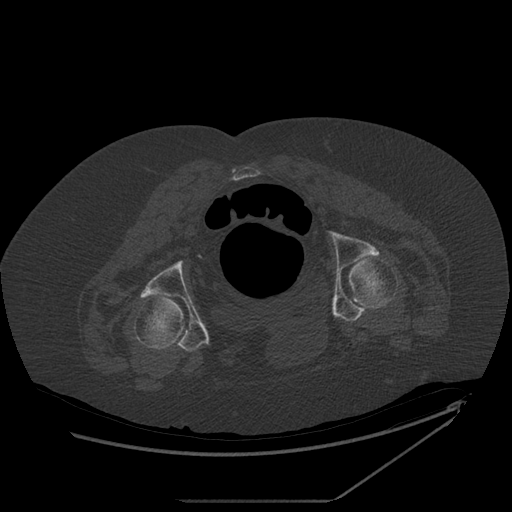
[im 207/413  soft-tissue]
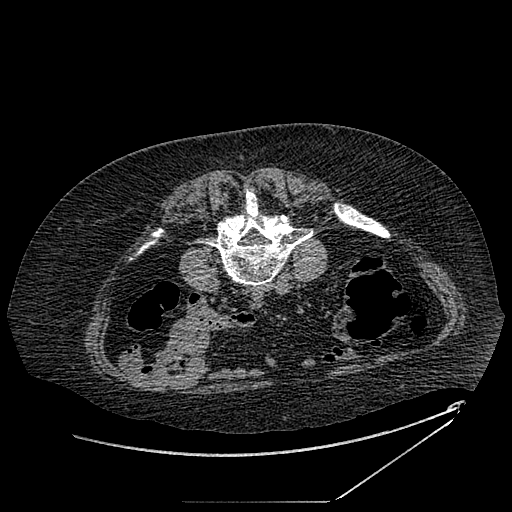
[im 207/413  lung]
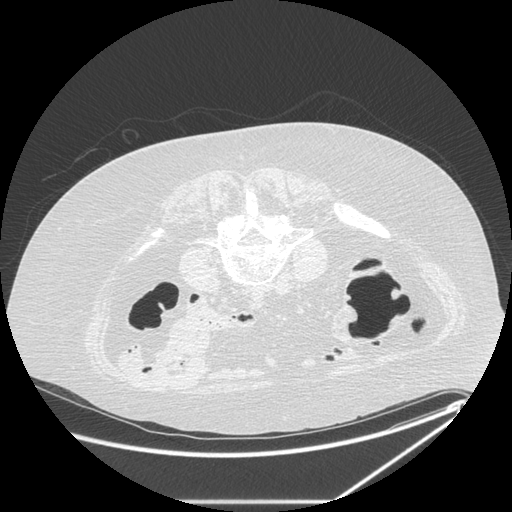
[im 310/413  soft-tissue]
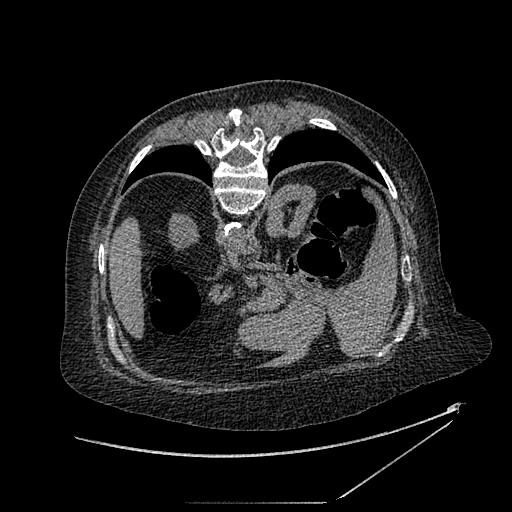
[im 310/413  lung]
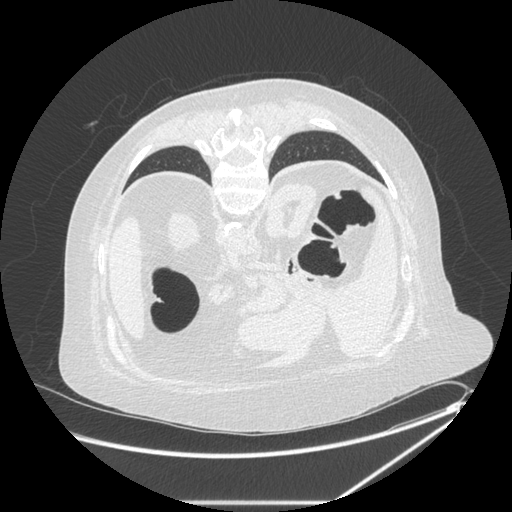

[Series 9: right decub 1.25 · axial · 0.90mm/px · z∈[-428,-174]mm · 3 of 407 slices shown]
[im 102/407  soft-tissue]
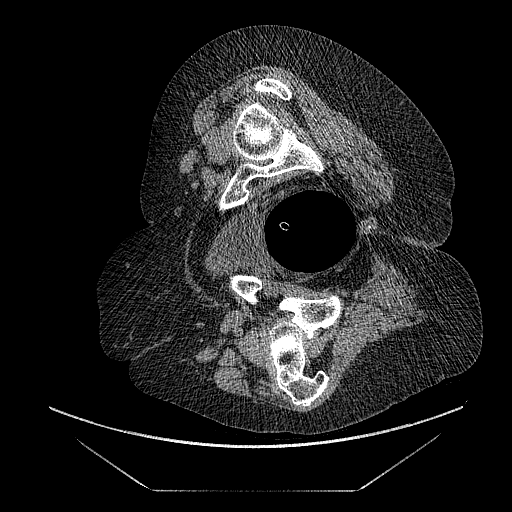
[im 204/407  soft-tissue]
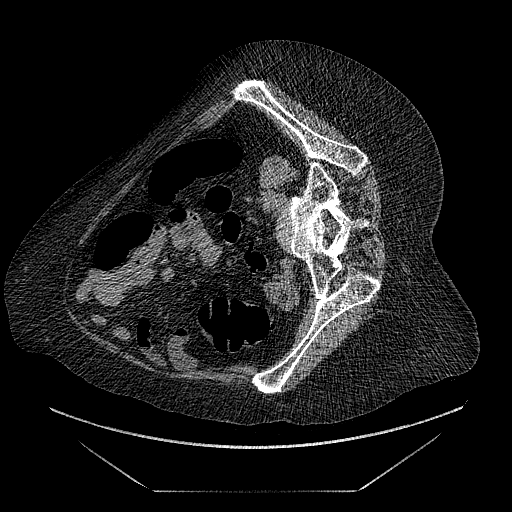
[im 305/407  soft-tissue]
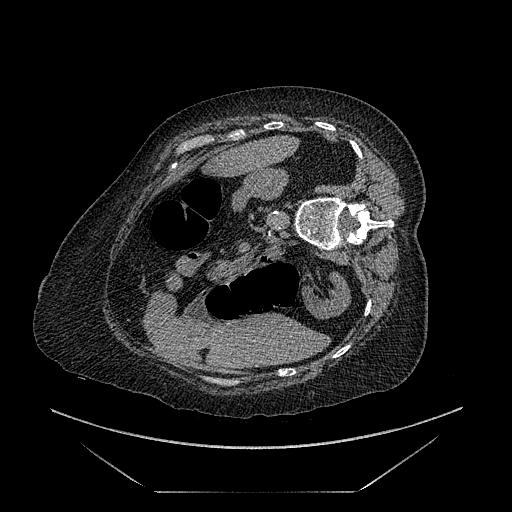

[Series 12: supine (id) · axial · 0.84mm/px · z∈[-475,-218]mm · 3 of 414 slices shown (2 of 2)]
[im 104/414  soft-tissue]
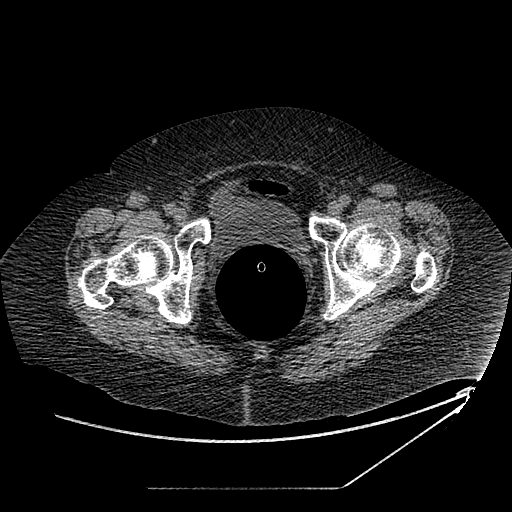
[im 207/414  soft-tissue]
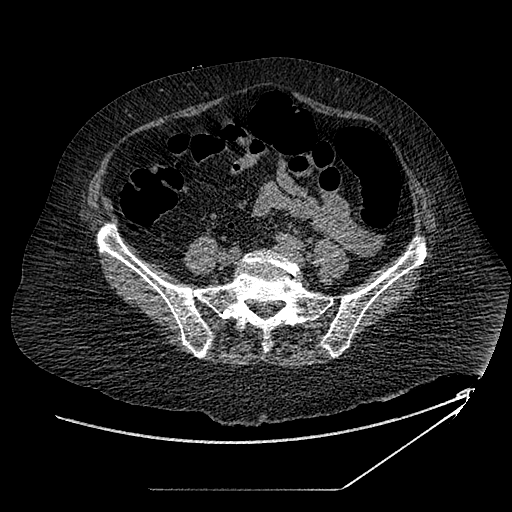
[im 310/414  soft-tissue]
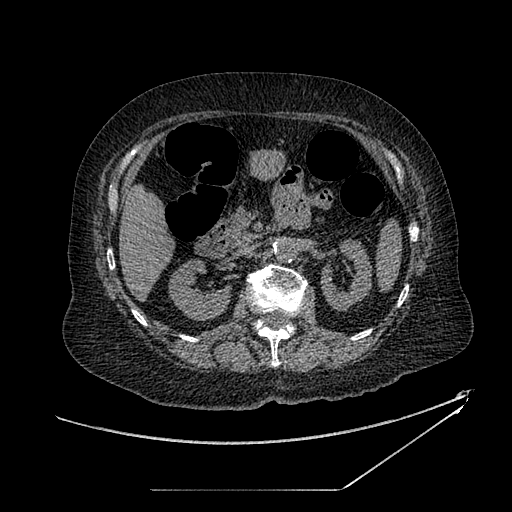

[11 of 36 positions shown; findings below may reference images not displayed]

FINDINGS: There is sigmoid diverticulosis. 6 mm polypoid area noted in the
sigmoid colon 30 cm from the rectum. Several other polypoid filling
defects are barium tagged and compatible with retained stool. No
annular constricting lesions.

Virtual colonoscopy is not designed to detect diminutive polyps
(i.e., less than or equal to 5 mm), the presence or absence of which
may not affect clinical management.

CT ABDOMEN AND PELVIS WITHOUT CONTRAST

Lower chest: Moderate to large hiatal hernia. Lung bases are clear.
Dense mitral valve annular calcifications present. Heart is normal
size. No effusions.

Hepatobiliary: Prior cholecystectomy.  No focal hepatic abnormality.

Pancreas: No focal abnormality or ductal dilatation.

Spleen: No focal abnormality.  Normal size.

Adrenals/Urinary Tract: No adrenal abnormality. No focal renal
abnormality. No stones or hydronephrosis. Urinary bladder is
unremarkable.

Stomach/Bowel: Stomach and small bowel decompressed, unremarkable.

Vascular/Lymphatic: Aortic calcifications. No aneurysm. No
adenopathy.

Reproductive: No uterine abnormality. Cystic areas within the right
pelvis, likely ovarian cysts. These measure 3.0 and 2.6 cm in
maximum diameter on the coronal reconstructed images.

Other: No free fluid or free air.

Musculoskeletal: No acute bony abnormality or focal bone lesion.
IMPRESSION: 6-7 mm followup in the sigmoid colon 30 cm from the rectum.
Recommend direct visualization with optical colonoscopy.

Moderate to large hiatal hernia.

Cystic areas within the right ovary, 3.0 cm and 2.6 cm. These could
be followed and further characterized with pelvic ultrasound in 6-12
months.

## 2018-09-25 DIAGNOSIS — D519 Vitamin B12 deficiency anemia, unspecified: Secondary | ICD-10-CM | POA: Diagnosis not present

## 2018-10-10 DIAGNOSIS — G894 Chronic pain syndrome: Secondary | ICD-10-CM | POA: Diagnosis not present

## 2018-10-10 DIAGNOSIS — M150 Primary generalized (osteo)arthritis: Secondary | ICD-10-CM | POA: Diagnosis not present

## 2018-10-10 DIAGNOSIS — Z79899 Other long term (current) drug therapy: Secondary | ICD-10-CM | POA: Diagnosis not present

## 2018-10-10 DIAGNOSIS — F1120 Opioid dependence, uncomplicated: Secondary | ICD-10-CM | POA: Diagnosis not present

## 2018-10-10 DIAGNOSIS — E039 Hypothyroidism, unspecified: Secondary | ICD-10-CM | POA: Diagnosis not present

## 2018-10-10 DIAGNOSIS — M461 Sacroiliitis, not elsewhere classified: Secondary | ICD-10-CM | POA: Diagnosis not present

## 2018-10-10 DIAGNOSIS — M545 Low back pain: Secondary | ICD-10-CM | POA: Diagnosis not present

## 2018-10-10 DIAGNOSIS — M5441 Lumbago with sciatica, right side: Secondary | ICD-10-CM | POA: Diagnosis not present

## 2018-10-15 DIAGNOSIS — D519 Vitamin B12 deficiency anemia, unspecified: Secondary | ICD-10-CM | POA: Diagnosis not present

## 2018-12-19 DIAGNOSIS — M5441 Lumbago with sciatica, right side: Secondary | ICD-10-CM | POA: Diagnosis not present

## 2018-12-19 DIAGNOSIS — G894 Chronic pain syndrome: Secondary | ICD-10-CM | POA: Diagnosis not present

## 2018-12-19 DIAGNOSIS — M545 Low back pain: Secondary | ICD-10-CM | POA: Diagnosis not present

## 2018-12-19 DIAGNOSIS — E039 Hypothyroidism, unspecified: Secondary | ICD-10-CM | POA: Diagnosis not present

## 2018-12-19 DIAGNOSIS — Z79899 Other long term (current) drug therapy: Secondary | ICD-10-CM | POA: Diagnosis not present

## 2018-12-19 DIAGNOSIS — M150 Primary generalized (osteo)arthritis: Secondary | ICD-10-CM | POA: Diagnosis not present

## 2018-12-19 DIAGNOSIS — F1120 Opioid dependence, uncomplicated: Secondary | ICD-10-CM | POA: Diagnosis not present

## 2018-12-19 DIAGNOSIS — M461 Sacroiliitis, not elsewhere classified: Secondary | ICD-10-CM | POA: Diagnosis not present

## 2019-01-17 DIAGNOSIS — H04123 Dry eye syndrome of bilateral lacrimal glands: Secondary | ICD-10-CM | POA: Diagnosis not present

## 2019-01-17 DIAGNOSIS — H5022 Vertical strabismus, left eye: Secondary | ICD-10-CM | POA: Diagnosis not present

## 2019-01-17 DIAGNOSIS — H5010 Unspecified exotropia: Secondary | ICD-10-CM | POA: Diagnosis not present

## 2019-01-17 DIAGNOSIS — H26493 Other secondary cataract, bilateral: Secondary | ICD-10-CM | POA: Diagnosis not present

## 2019-01-22 DIAGNOSIS — D519 Vitamin B12 deficiency anemia, unspecified: Secondary | ICD-10-CM | POA: Diagnosis not present

## 2019-01-31 DIAGNOSIS — M1712 Unilateral primary osteoarthritis, left knee: Secondary | ICD-10-CM | POA: Diagnosis not present

## 2019-02-07 DIAGNOSIS — H4912 Fourth [trochlear] nerve palsy, left eye: Secondary | ICD-10-CM | POA: Diagnosis not present

## 2019-02-07 DIAGNOSIS — H04123 Dry eye syndrome of bilateral lacrimal glands: Secondary | ICD-10-CM | POA: Diagnosis not present

## 2019-02-07 DIAGNOSIS — H5022 Vertical strabismus, left eye: Secondary | ICD-10-CM | POA: Diagnosis not present

## 2019-02-07 DIAGNOSIS — H5010 Unspecified exotropia: Secondary | ICD-10-CM | POA: Diagnosis not present

## 2019-02-09 DIAGNOSIS — Z23 Encounter for immunization: Secondary | ICD-10-CM | POA: Diagnosis not present

## 2019-02-11 DIAGNOSIS — L719 Rosacea, unspecified: Secondary | ICD-10-CM | POA: Diagnosis not present

## 2019-02-11 DIAGNOSIS — L299 Pruritus, unspecified: Secondary | ICD-10-CM | POA: Diagnosis not present

## 2019-02-27 DIAGNOSIS — E039 Hypothyroidism, unspecified: Secondary | ICD-10-CM | POA: Diagnosis not present

## 2019-02-27 DIAGNOSIS — M545 Low back pain: Secondary | ICD-10-CM | POA: Diagnosis not present

## 2019-02-27 DIAGNOSIS — M461 Sacroiliitis, not elsewhere classified: Secondary | ICD-10-CM | POA: Diagnosis not present

## 2019-02-27 DIAGNOSIS — M150 Primary generalized (osteo)arthritis: Secondary | ICD-10-CM | POA: Diagnosis not present

## 2019-02-27 DIAGNOSIS — G894 Chronic pain syndrome: Secondary | ICD-10-CM | POA: Diagnosis not present

## 2019-02-27 DIAGNOSIS — M5441 Lumbago with sciatica, right side: Secondary | ICD-10-CM | POA: Diagnosis not present

## 2019-02-27 DIAGNOSIS — F1120 Opioid dependence, uncomplicated: Secondary | ICD-10-CM | POA: Diagnosis not present

## 2019-02-27 DIAGNOSIS — Z79899 Other long term (current) drug therapy: Secondary | ICD-10-CM | POA: Diagnosis not present

## 2019-02-28 DIAGNOSIS — D500 Iron deficiency anemia secondary to blood loss (chronic): Secondary | ICD-10-CM | POA: Diagnosis not present

## 2019-03-07 DIAGNOSIS — D500 Iron deficiency anemia secondary to blood loss (chronic): Secondary | ICD-10-CM | POA: Diagnosis not present

## 2019-03-07 DIAGNOSIS — Z800 Family history of malignant neoplasm of digestive organs: Secondary | ICD-10-CM | POA: Diagnosis not present

## 2019-03-07 DIAGNOSIS — Z86010 Personal history of colonic polyps: Secondary | ICD-10-CM | POA: Diagnosis not present

## 2019-03-07 DIAGNOSIS — R195 Other fecal abnormalities: Secondary | ICD-10-CM | POA: Diagnosis not present

## 2019-03-09 DIAGNOSIS — D500 Iron deficiency anemia secondary to blood loss (chronic): Secondary | ICD-10-CM | POA: Diagnosis not present

## 2019-03-13 ENCOUNTER — Other Ambulatory Visit: Payer: Self-pay | Admitting: Unknown Physician Specialty

## 2019-03-13 DIAGNOSIS — D500 Iron deficiency anemia secondary to blood loss (chronic): Secondary | ICD-10-CM

## 2019-04-01 ENCOUNTER — Ambulatory Visit
Admission: RE | Admit: 2019-04-01 | Discharge: 2019-04-01 | Disposition: A | Discharge status: Home / Self Care | Payer: Medicare HMO | Source: Ambulatory Visit | Attending: Unknown Physician Specialty | Admitting: Unknown Physician Specialty

## 2019-04-01 DIAGNOSIS — K5730 Diverticulosis of large intestine without perforation or abscess without bleeding: Secondary | ICD-10-CM | POA: Diagnosis not present

## 2019-04-01 DIAGNOSIS — K635 Polyp of colon: Secondary | ICD-10-CM | POA: Diagnosis not present

## 2019-04-01 DIAGNOSIS — D500 Iron deficiency anemia secondary to blood loss (chronic): Secondary | ICD-10-CM

## 2019-04-12 DIAGNOSIS — D519 Vitamin B12 deficiency anemia, unspecified: Secondary | ICD-10-CM | POA: Diagnosis not present

## 2019-05-07 DIAGNOSIS — D519 Vitamin B12 deficiency anemia, unspecified: Secondary | ICD-10-CM | POA: Diagnosis not present

## 2019-05-07 DIAGNOSIS — N83209 Unspecified ovarian cyst, unspecified side: Secondary | ICD-10-CM | POA: Diagnosis not present

## 2019-05-07 DIAGNOSIS — R195 Other fecal abnormalities: Secondary | ICD-10-CM | POA: Diagnosis not present

## 2019-05-07 DIAGNOSIS — Z86010 Personal history of colonic polyps: Secondary | ICD-10-CM | POA: Diagnosis not present

## 2019-05-07 DIAGNOSIS — D500 Iron deficiency anemia secondary to blood loss (chronic): Secondary | ICD-10-CM | POA: Diagnosis not present

## 2019-05-15 DIAGNOSIS — G894 Chronic pain syndrome: Secondary | ICD-10-CM | POA: Diagnosis not present

## 2019-05-15 DIAGNOSIS — M150 Primary generalized (osteo)arthritis: Secondary | ICD-10-CM | POA: Diagnosis not present

## 2019-05-15 DIAGNOSIS — E039 Hypothyroidism, unspecified: Secondary | ICD-10-CM | POA: Diagnosis not present

## 2019-05-15 DIAGNOSIS — Z79899 Other long term (current) drug therapy: Secondary | ICD-10-CM | POA: Diagnosis not present

## 2019-05-15 DIAGNOSIS — F1120 Opioid dependence, uncomplicated: Secondary | ICD-10-CM | POA: Diagnosis not present

## 2019-05-15 DIAGNOSIS — M461 Sacroiliitis, not elsewhere classified: Secondary | ICD-10-CM | POA: Diagnosis not present

## 2019-05-15 DIAGNOSIS — M5441 Lumbago with sciatica, right side: Secondary | ICD-10-CM | POA: Diagnosis not present

## 2019-05-16 DIAGNOSIS — C73 Malignant neoplasm of thyroid gland: Secondary | ICD-10-CM | POA: Diagnosis not present

## 2019-07-10 DIAGNOSIS — F1120 Opioid dependence, uncomplicated: Secondary | ICD-10-CM | POA: Diagnosis not present

## 2019-07-10 DIAGNOSIS — M5441 Lumbago with sciatica, right side: Secondary | ICD-10-CM | POA: Diagnosis not present

## 2019-07-10 DIAGNOSIS — M545 Low back pain: Secondary | ICD-10-CM | POA: Diagnosis not present

## 2019-07-10 DIAGNOSIS — G894 Chronic pain syndrome: Secondary | ICD-10-CM | POA: Diagnosis not present

## 2019-07-10 DIAGNOSIS — M150 Primary generalized (osteo)arthritis: Secondary | ICD-10-CM | POA: Diagnosis not present

## 2019-07-10 DIAGNOSIS — M461 Sacroiliitis, not elsewhere classified: Secondary | ICD-10-CM | POA: Diagnosis not present

## 2019-07-10 DIAGNOSIS — E039 Hypothyroidism, unspecified: Secondary | ICD-10-CM | POA: Diagnosis not present

## 2019-07-10 DIAGNOSIS — Z79899 Other long term (current) drug therapy: Secondary | ICD-10-CM | POA: Diagnosis not present

## 2019-07-26 DIAGNOSIS — D519 Vitamin B12 deficiency anemia, unspecified: Secondary | ICD-10-CM | POA: Diagnosis not present

## 2019-08-16 DIAGNOSIS — C73 Malignant neoplasm of thyroid gland: Secondary | ICD-10-CM | POA: Diagnosis not present

## 2019-08-16 DIAGNOSIS — Z923 Personal history of irradiation: Secondary | ICD-10-CM | POA: Diagnosis not present

## 2019-08-16 DIAGNOSIS — E890 Postprocedural hypothyroidism: Secondary | ICD-10-CM | POA: Diagnosis not present

## 2019-08-16 DIAGNOSIS — Z85850 Personal history of malignant neoplasm of thyroid: Secondary | ICD-10-CM | POA: Diagnosis not present

## 2019-08-16 DIAGNOSIS — Z08 Encounter for follow-up examination after completed treatment for malignant neoplasm: Secondary | ICD-10-CM | POA: Diagnosis not present

## 2019-08-16 DIAGNOSIS — Z79899 Other long term (current) drug therapy: Secondary | ICD-10-CM | POA: Diagnosis not present

## 2019-09-04 DIAGNOSIS — Z79899 Other long term (current) drug therapy: Secondary | ICD-10-CM | POA: Diagnosis not present

## 2019-09-04 DIAGNOSIS — M545 Low back pain: Secondary | ICD-10-CM | POA: Diagnosis not present

## 2019-09-04 DIAGNOSIS — M461 Sacroiliitis, not elsewhere classified: Secondary | ICD-10-CM | POA: Diagnosis not present

## 2019-09-04 DIAGNOSIS — M5441 Lumbago with sciatica, right side: Secondary | ICD-10-CM | POA: Diagnosis not present

## 2019-09-04 DIAGNOSIS — F1120 Opioid dependence, uncomplicated: Secondary | ICD-10-CM | POA: Diagnosis not present

## 2019-09-04 DIAGNOSIS — E039 Hypothyroidism, unspecified: Secondary | ICD-10-CM | POA: Diagnosis not present

## 2019-09-04 DIAGNOSIS — G894 Chronic pain syndrome: Secondary | ICD-10-CM | POA: Diagnosis not present

## 2019-09-04 DIAGNOSIS — M150 Primary generalized (osteo)arthritis: Secondary | ICD-10-CM | POA: Diagnosis not present

## 2019-09-23 DIAGNOSIS — D519 Vitamin B12 deficiency anemia, unspecified: Secondary | ICD-10-CM | POA: Diagnosis not present

## 2019-09-25 DIAGNOSIS — D500 Iron deficiency anemia secondary to blood loss (chronic): Secondary | ICD-10-CM | POA: Diagnosis not present

## 2019-10-03 DIAGNOSIS — D500 Iron deficiency anemia secondary to blood loss (chronic): Secondary | ICD-10-CM | POA: Diagnosis not present

## 2019-10-03 DIAGNOSIS — R195 Other fecal abnormalities: Secondary | ICD-10-CM | POA: Diagnosis not present

## 2019-10-17 DIAGNOSIS — D519 Vitamin B12 deficiency anemia, unspecified: Secondary | ICD-10-CM | POA: Diagnosis not present

## 2019-11-13 DIAGNOSIS — M5441 Lumbago with sciatica, right side: Secondary | ICD-10-CM | POA: Diagnosis not present

## 2019-11-13 DIAGNOSIS — M150 Primary generalized (osteo)arthritis: Secondary | ICD-10-CM | POA: Diagnosis not present

## 2019-11-13 DIAGNOSIS — M461 Sacroiliitis, not elsewhere classified: Secondary | ICD-10-CM | POA: Diagnosis not present

## 2019-11-13 DIAGNOSIS — G894 Chronic pain syndrome: Secondary | ICD-10-CM | POA: Diagnosis not present

## 2019-11-13 DIAGNOSIS — M545 Low back pain: Secondary | ICD-10-CM | POA: Diagnosis not present

## 2019-11-13 DIAGNOSIS — Z79899 Other long term (current) drug therapy: Secondary | ICD-10-CM | POA: Diagnosis not present

## 2019-11-13 DIAGNOSIS — E039 Hypothyroidism, unspecified: Secondary | ICD-10-CM | POA: Diagnosis not present

## 2019-11-13 DIAGNOSIS — F1120 Opioid dependence, uncomplicated: Secondary | ICD-10-CM | POA: Diagnosis not present

## 2019-12-12 DIAGNOSIS — M1711 Unilateral primary osteoarthritis, right knee: Secondary | ICD-10-CM | POA: Diagnosis not present

## 2019-12-12 DIAGNOSIS — M7051 Other bursitis of knee, right knee: Secondary | ICD-10-CM | POA: Diagnosis not present

## 2019-12-17 DIAGNOSIS — D519 Vitamin B12 deficiency anemia, unspecified: Secondary | ICD-10-CM | POA: Diagnosis not present

## 2019-12-25 DIAGNOSIS — M1711 Unilateral primary osteoarthritis, right knee: Secondary | ICD-10-CM | POA: Diagnosis not present

## 2019-12-25 DIAGNOSIS — M7051 Other bursitis of knee, right knee: Secondary | ICD-10-CM | POA: Diagnosis not present

## 2020-01-08 DIAGNOSIS — H5010 Unspecified exotropia: Secondary | ICD-10-CM | POA: Diagnosis not present

## 2020-01-08 DIAGNOSIS — H04123 Dry eye syndrome of bilateral lacrimal glands: Secondary | ICD-10-CM | POA: Diagnosis not present

## 2020-01-08 DIAGNOSIS — H4912 Fourth [trochlear] nerve palsy, left eye: Secondary | ICD-10-CM | POA: Diagnosis not present

## 2020-01-08 DIAGNOSIS — H5022 Vertical strabismus, left eye: Secondary | ICD-10-CM | POA: Diagnosis not present

## 2020-01-09 DIAGNOSIS — D225 Melanocytic nevi of trunk: Secondary | ICD-10-CM | POA: Diagnosis not present

## 2020-01-09 DIAGNOSIS — D2239 Melanocytic nevi of other parts of face: Secondary | ICD-10-CM | POA: Diagnosis not present

## 2020-01-09 DIAGNOSIS — L719 Rosacea, unspecified: Secondary | ICD-10-CM | POA: Diagnosis not present

## 2020-01-09 DIAGNOSIS — L814 Other melanin hyperpigmentation: Secondary | ICD-10-CM | POA: Diagnosis not present

## 2020-01-13 DIAGNOSIS — M1712 Unilateral primary osteoarthritis, left knee: Secondary | ICD-10-CM | POA: Diagnosis not present

## 2020-01-17 DIAGNOSIS — E559 Vitamin D deficiency, unspecified: Secondary | ICD-10-CM | POA: Diagnosis not present

## 2020-01-17 DIAGNOSIS — Z6836 Body mass index (BMI) 36.0-36.9, adult: Secondary | ICD-10-CM | POA: Diagnosis not present

## 2020-01-17 DIAGNOSIS — Z0000 Encounter for general adult medical examination without abnormal findings: Secondary | ICD-10-CM | POA: Diagnosis not present

## 2020-01-17 DIAGNOSIS — Z79899 Other long term (current) drug therapy: Secondary | ICD-10-CM | POA: Diagnosis not present

## 2020-01-17 DIAGNOSIS — E785 Hyperlipidemia, unspecified: Secondary | ICD-10-CM | POA: Diagnosis not present

## 2020-01-22 DIAGNOSIS — F1120 Opioid dependence, uncomplicated: Secondary | ICD-10-CM | POA: Diagnosis not present

## 2020-01-22 DIAGNOSIS — M150 Primary generalized (osteo)arthritis: Secondary | ICD-10-CM | POA: Diagnosis not present

## 2020-01-22 DIAGNOSIS — G894 Chronic pain syndrome: Secondary | ICD-10-CM | POA: Diagnosis not present

## 2020-01-22 DIAGNOSIS — M461 Sacroiliitis, not elsewhere classified: Secondary | ICD-10-CM | POA: Diagnosis not present

## 2020-01-22 DIAGNOSIS — M5441 Lumbago with sciatica, right side: Secondary | ICD-10-CM | POA: Diagnosis not present

## 2020-01-22 DIAGNOSIS — Z79899 Other long term (current) drug therapy: Secondary | ICD-10-CM | POA: Diagnosis not present

## 2020-02-06 DIAGNOSIS — Z1231 Encounter for screening mammogram for malignant neoplasm of breast: Secondary | ICD-10-CM | POA: Diagnosis not present

## 2020-02-07 DIAGNOSIS — Z13820 Encounter for screening for osteoporosis: Secondary | ICD-10-CM | POA: Diagnosis not present

## 2020-02-07 DIAGNOSIS — M810 Age-related osteoporosis without current pathological fracture: Secondary | ICD-10-CM | POA: Diagnosis not present

## 2020-03-03 DIAGNOSIS — Z79891 Long term (current) use of opiate analgesic: Secondary | ICD-10-CM | POA: Diagnosis not present

## 2020-03-03 DIAGNOSIS — F1120 Opioid dependence, uncomplicated: Secondary | ICD-10-CM | POA: Diagnosis not present

## 2020-03-04 DIAGNOSIS — D519 Vitamin B12 deficiency anemia, unspecified: Secondary | ICD-10-CM | POA: Diagnosis not present

## 2020-03-04 DIAGNOSIS — Z23 Encounter for immunization: Secondary | ICD-10-CM | POA: Diagnosis not present

## 2020-03-18 DIAGNOSIS — G894 Chronic pain syndrome: Secondary | ICD-10-CM | POA: Diagnosis not present

## 2020-03-18 DIAGNOSIS — E039 Hypothyroidism, unspecified: Secondary | ICD-10-CM | POA: Diagnosis not present

## 2020-03-18 DIAGNOSIS — M150 Primary generalized (osteo)arthritis: Secondary | ICD-10-CM | POA: Diagnosis not present

## 2020-03-18 DIAGNOSIS — M461 Sacroiliitis, not elsewhere classified: Secondary | ICD-10-CM | POA: Diagnosis not present

## 2020-03-18 DIAGNOSIS — Z79899 Other long term (current) drug therapy: Secondary | ICD-10-CM | POA: Diagnosis not present

## 2020-03-18 DIAGNOSIS — F1120 Opioid dependence, uncomplicated: Secondary | ICD-10-CM | POA: Diagnosis not present

## 2020-03-18 DIAGNOSIS — M5441 Lumbago with sciatica, right side: Secondary | ICD-10-CM | POA: Diagnosis not present

## 2020-03-18 DIAGNOSIS — M545 Low back pain, unspecified: Secondary | ICD-10-CM | POA: Diagnosis not present

## 2020-03-31 DIAGNOSIS — D500 Iron deficiency anemia secondary to blood loss (chronic): Secondary | ICD-10-CM | POA: Diagnosis not present

## 2020-04-01 DIAGNOSIS — D519 Vitamin B12 deficiency anemia, unspecified: Secondary | ICD-10-CM | POA: Diagnosis not present

## 2020-04-07 DIAGNOSIS — D500 Iron deficiency anemia secondary to blood loss (chronic): Secondary | ICD-10-CM | POA: Diagnosis not present

## 2020-04-07 DIAGNOSIS — R195 Other fecal abnormalities: Secondary | ICD-10-CM | POA: Diagnosis not present

## 2020-05-07 DIAGNOSIS — Z79891 Long term (current) use of opiate analgesic: Secondary | ICD-10-CM | POA: Diagnosis not present

## 2020-05-07 DIAGNOSIS — F1120 Opioid dependence, uncomplicated: Secondary | ICD-10-CM | POA: Diagnosis not present

## 2020-05-13 DIAGNOSIS — Z79899 Other long term (current) drug therapy: Secondary | ICD-10-CM | POA: Diagnosis not present

## 2020-05-13 DIAGNOSIS — M461 Sacroiliitis, not elsewhere classified: Secondary | ICD-10-CM | POA: Diagnosis not present

## 2020-05-13 DIAGNOSIS — M5441 Lumbago with sciatica, right side: Secondary | ICD-10-CM | POA: Diagnosis not present

## 2020-05-13 DIAGNOSIS — G894 Chronic pain syndrome: Secondary | ICD-10-CM | POA: Diagnosis not present

## 2020-05-13 DIAGNOSIS — E039 Hypothyroidism, unspecified: Secondary | ICD-10-CM | POA: Diagnosis not present

## 2020-05-13 DIAGNOSIS — M150 Primary generalized (osteo)arthritis: Secondary | ICD-10-CM | POA: Diagnosis not present

## 2020-05-13 DIAGNOSIS — F1120 Opioid dependence, uncomplicated: Secondary | ICD-10-CM | POA: Diagnosis not present

## 2020-05-15 DIAGNOSIS — D519 Vitamin B12 deficiency anemia, unspecified: Secondary | ICD-10-CM | POA: Diagnosis not present

## 2020-06-22 DIAGNOSIS — D519 Vitamin B12 deficiency anemia, unspecified: Secondary | ICD-10-CM | POA: Diagnosis not present

## 2020-06-30 DIAGNOSIS — F1120 Opioid dependence, uncomplicated: Secondary | ICD-10-CM | POA: Diagnosis not present

## 2020-06-30 DIAGNOSIS — Z79891 Long term (current) use of opiate analgesic: Secondary | ICD-10-CM | POA: Diagnosis not present

## 2020-07-22 DIAGNOSIS — M461 Sacroiliitis, not elsewhere classified: Secondary | ICD-10-CM | POA: Diagnosis not present

## 2020-07-22 DIAGNOSIS — M5441 Lumbago with sciatica, right side: Secondary | ICD-10-CM | POA: Diagnosis not present

## 2020-07-22 DIAGNOSIS — Z79899 Other long term (current) drug therapy: Secondary | ICD-10-CM | POA: Diagnosis not present

## 2020-07-22 DIAGNOSIS — F1120 Opioid dependence, uncomplicated: Secondary | ICD-10-CM | POA: Diagnosis not present

## 2020-07-22 DIAGNOSIS — E039 Hypothyroidism, unspecified: Secondary | ICD-10-CM | POA: Diagnosis not present

## 2020-07-22 DIAGNOSIS — M150 Primary generalized (osteo)arthritis: Secondary | ICD-10-CM | POA: Diagnosis not present

## 2020-07-22 DIAGNOSIS — M545 Low back pain, unspecified: Secondary | ICD-10-CM | POA: Diagnosis not present

## 2020-07-22 DIAGNOSIS — G894 Chronic pain syndrome: Secondary | ICD-10-CM | POA: Diagnosis not present

## 2020-07-23 DIAGNOSIS — D519 Vitamin B12 deficiency anemia, unspecified: Secondary | ICD-10-CM | POA: Diagnosis not present

## 2020-08-21 DIAGNOSIS — C73 Malignant neoplasm of thyroid gland: Secondary | ICD-10-CM | POA: Diagnosis not present

## 2020-08-26 DIAGNOSIS — D519 Vitamin B12 deficiency anemia, unspecified: Secondary | ICD-10-CM | POA: Diagnosis not present

## 2020-09-23 DIAGNOSIS — F1120 Opioid dependence, uncomplicated: Secondary | ICD-10-CM | POA: Diagnosis not present

## 2020-09-23 DIAGNOSIS — Z79891 Long term (current) use of opiate analgesic: Secondary | ICD-10-CM | POA: Diagnosis not present

## 2020-09-30 DIAGNOSIS — M5441 Lumbago with sciatica, right side: Secondary | ICD-10-CM | POA: Diagnosis not present

## 2020-09-30 DIAGNOSIS — M150 Primary generalized (osteo)arthritis: Secondary | ICD-10-CM | POA: Diagnosis not present

## 2020-09-30 DIAGNOSIS — M461 Sacroiliitis, not elsewhere classified: Secondary | ICD-10-CM | POA: Diagnosis not present

## 2020-09-30 DIAGNOSIS — Z79899 Other long term (current) drug therapy: Secondary | ICD-10-CM | POA: Diagnosis not present

## 2020-09-30 DIAGNOSIS — G894 Chronic pain syndrome: Secondary | ICD-10-CM | POA: Diagnosis not present

## 2020-09-30 DIAGNOSIS — F1120 Opioid dependence, uncomplicated: Secondary | ICD-10-CM | POA: Diagnosis not present

## 2020-09-30 DIAGNOSIS — E039 Hypothyroidism, unspecified: Secondary | ICD-10-CM | POA: Diagnosis not present

## 2020-09-30 DIAGNOSIS — M545 Low back pain, unspecified: Secondary | ICD-10-CM | POA: Diagnosis not present

## 2020-10-12 DIAGNOSIS — D519 Vitamin B12 deficiency anemia, unspecified: Secondary | ICD-10-CM | POA: Diagnosis not present

## 2020-11-03 DIAGNOSIS — F1120 Opioid dependence, uncomplicated: Secondary | ICD-10-CM | POA: Diagnosis not present

## 2020-11-03 DIAGNOSIS — Z79891 Long term (current) use of opiate analgesic: Secondary | ICD-10-CM | POA: Diagnosis not present

## 2020-11-12 DIAGNOSIS — D519 Vitamin B12 deficiency anemia, unspecified: Secondary | ICD-10-CM | POA: Diagnosis not present

## 2020-11-23 DIAGNOSIS — D500 Iron deficiency anemia secondary to blood loss (chronic): Secondary | ICD-10-CM | POA: Diagnosis not present

## 2020-11-25 DIAGNOSIS — Z79899 Other long term (current) drug therapy: Secondary | ICD-10-CM | POA: Diagnosis not present

## 2020-11-25 DIAGNOSIS — M545 Low back pain, unspecified: Secondary | ICD-10-CM | POA: Diagnosis not present

## 2020-11-25 DIAGNOSIS — E039 Hypothyroidism, unspecified: Secondary | ICD-10-CM | POA: Diagnosis not present

## 2020-11-25 DIAGNOSIS — M5441 Lumbago with sciatica, right side: Secondary | ICD-10-CM | POA: Diagnosis not present

## 2020-11-25 DIAGNOSIS — M150 Primary generalized (osteo)arthritis: Secondary | ICD-10-CM | POA: Diagnosis not present

## 2020-11-25 DIAGNOSIS — M461 Sacroiliitis, not elsewhere classified: Secondary | ICD-10-CM | POA: Diagnosis not present

## 2020-11-25 DIAGNOSIS — F1120 Opioid dependence, uncomplicated: Secondary | ICD-10-CM | POA: Diagnosis not present

## 2020-11-25 DIAGNOSIS — G894 Chronic pain syndrome: Secondary | ICD-10-CM | POA: Diagnosis not present

## 2020-12-01 DIAGNOSIS — R195 Other fecal abnormalities: Secondary | ICD-10-CM | POA: Diagnosis not present

## 2020-12-01 DIAGNOSIS — D500 Iron deficiency anemia secondary to blood loss (chronic): Secondary | ICD-10-CM | POA: Diagnosis not present

## 2020-12-07 DIAGNOSIS — M7051 Other bursitis of knee, right knee: Secondary | ICD-10-CM | POA: Diagnosis not present

## 2020-12-31 DIAGNOSIS — M1712 Unilateral primary osteoarthritis, left knee: Secondary | ICD-10-CM | POA: Diagnosis not present

## 2021-01-12 DIAGNOSIS — D2239 Melanocytic nevi of other parts of face: Secondary | ICD-10-CM | POA: Diagnosis not present

## 2021-01-12 DIAGNOSIS — D225 Melanocytic nevi of trunk: Secondary | ICD-10-CM | POA: Diagnosis not present

## 2021-01-12 DIAGNOSIS — D485 Neoplasm of uncertain behavior of skin: Secondary | ICD-10-CM | POA: Diagnosis not present

## 2021-01-12 DIAGNOSIS — D1801 Hemangioma of skin and subcutaneous tissue: Secondary | ICD-10-CM | POA: Diagnosis not present

## 2021-01-12 DIAGNOSIS — L814 Other melanin hyperpigmentation: Secondary | ICD-10-CM | POA: Diagnosis not present

## 2021-01-18 DIAGNOSIS — Z79899 Other long term (current) drug therapy: Secondary | ICD-10-CM | POA: Diagnosis not present

## 2021-01-19 DIAGNOSIS — Z79899 Other long term (current) drug therapy: Secondary | ICD-10-CM | POA: Diagnosis not present

## 2021-01-19 DIAGNOSIS — Z0000 Encounter for general adult medical examination without abnormal findings: Secondary | ICD-10-CM | POA: Diagnosis not present

## 2021-01-19 DIAGNOSIS — E785 Hyperlipidemia, unspecified: Secondary | ICD-10-CM | POA: Diagnosis not present

## 2021-01-19 DIAGNOSIS — Z6835 Body mass index (BMI) 35.0-35.9, adult: Secondary | ICD-10-CM | POA: Diagnosis not present

## 2021-01-20 DIAGNOSIS — M461 Sacroiliitis, not elsewhere classified: Secondary | ICD-10-CM | POA: Diagnosis not present

## 2021-01-20 DIAGNOSIS — F1120 Opioid dependence, uncomplicated: Secondary | ICD-10-CM | POA: Diagnosis not present

## 2021-01-20 DIAGNOSIS — M545 Low back pain, unspecified: Secondary | ICD-10-CM | POA: Diagnosis not present

## 2021-01-20 DIAGNOSIS — G894 Chronic pain syndrome: Secondary | ICD-10-CM | POA: Diagnosis not present

## 2021-01-25 DIAGNOSIS — N83201 Unspecified ovarian cyst, right side: Secondary | ICD-10-CM | POA: Diagnosis not present

## 2021-01-25 DIAGNOSIS — D259 Leiomyoma of uterus, unspecified: Secondary | ICD-10-CM | POA: Diagnosis not present

## 2021-01-28 DIAGNOSIS — D519 Vitamin B12 deficiency anemia, unspecified: Secondary | ICD-10-CM | POA: Diagnosis not present

## 2021-02-09 DIAGNOSIS — Z1231 Encounter for screening mammogram for malignant neoplasm of breast: Secondary | ICD-10-CM | POA: Diagnosis not present

## 2021-02-18 DIAGNOSIS — Z6835 Body mass index (BMI) 35.0-35.9, adult: Secondary | ICD-10-CM | POA: Diagnosis not present

## 2021-02-18 DIAGNOSIS — N390 Urinary tract infection, site not specified: Secondary | ICD-10-CM | POA: Diagnosis not present

## 2021-02-23 DIAGNOSIS — Z13820 Encounter for screening for osteoporosis: Secondary | ICD-10-CM | POA: Diagnosis not present

## 2021-02-23 DIAGNOSIS — D519 Vitamin B12 deficiency anemia, unspecified: Secondary | ICD-10-CM | POA: Diagnosis not present

## 2021-03-04 DIAGNOSIS — D500 Iron deficiency anemia secondary to blood loss (chronic): Secondary | ICD-10-CM | POA: Diagnosis not present

## 2021-03-22 DIAGNOSIS — G894 Chronic pain syndrome: Secondary | ICD-10-CM | POA: Diagnosis not present

## 2021-03-25 DIAGNOSIS — M79675 Pain in left toe(s): Secondary | ICD-10-CM | POA: Diagnosis not present

## 2021-03-25 DIAGNOSIS — L600 Ingrowing nail: Secondary | ICD-10-CM | POA: Diagnosis not present

## 2021-03-31 DIAGNOSIS — M545 Low back pain, unspecified: Secondary | ICD-10-CM | POA: Diagnosis not present

## 2021-03-31 DIAGNOSIS — M25551 Pain in right hip: Secondary | ICD-10-CM | POA: Diagnosis not present

## 2021-04-08 DIAGNOSIS — L600 Ingrowing nail: Secondary | ICD-10-CM | POA: Diagnosis not present

## 2021-04-08 DIAGNOSIS — M79675 Pain in left toe(s): Secondary | ICD-10-CM | POA: Diagnosis not present

## 2021-04-19 DIAGNOSIS — M19012 Primary osteoarthritis, left shoulder: Secondary | ICD-10-CM | POA: Diagnosis not present

## 2021-04-19 DIAGNOSIS — M25512 Pain in left shoulder: Secondary | ICD-10-CM | POA: Diagnosis not present

## 2021-04-21 DIAGNOSIS — Z23 Encounter for immunization: Secondary | ICD-10-CM | POA: Diagnosis not present

## 2021-05-17 DIAGNOSIS — G894 Chronic pain syndrome: Secondary | ICD-10-CM | POA: Diagnosis not present

## 2021-05-18 DIAGNOSIS — H903 Sensorineural hearing loss, bilateral: Secondary | ICD-10-CM | POA: Diagnosis not present

## 2021-05-27 DIAGNOSIS — H26493 Other secondary cataract, bilateral: Secondary | ICD-10-CM | POA: Diagnosis not present

## 2021-05-27 DIAGNOSIS — H5022 Vertical strabismus, left eye: Secondary | ICD-10-CM | POA: Diagnosis not present

## 2021-05-27 DIAGNOSIS — H524 Presbyopia: Secondary | ICD-10-CM | POA: Diagnosis not present

## 2021-05-27 DIAGNOSIS — H04123 Dry eye syndrome of bilateral lacrimal glands: Secondary | ICD-10-CM | POA: Diagnosis not present

## 2021-05-27 DIAGNOSIS — H4322 Crystalline deposits in vitreous body, left eye: Secondary | ICD-10-CM | POA: Diagnosis not present

## 2021-05-27 DIAGNOSIS — H5213 Myopia, bilateral: Secondary | ICD-10-CM | POA: Diagnosis not present

## 2021-05-27 DIAGNOSIS — H52222 Regular astigmatism, left eye: Secondary | ICD-10-CM | POA: Diagnosis not present

## 2021-05-28 DIAGNOSIS — D500 Iron deficiency anemia secondary to blood loss (chronic): Secondary | ICD-10-CM | POA: Diagnosis not present

## 2021-06-03 DIAGNOSIS — Z1212 Encounter for screening for malignant neoplasm of rectum: Secondary | ICD-10-CM | POA: Diagnosis not present

## 2021-06-03 DIAGNOSIS — D500 Iron deficiency anemia secondary to blood loss (chronic): Secondary | ICD-10-CM | POA: Diagnosis not present

## 2021-06-03 DIAGNOSIS — R195 Other fecal abnormalities: Secondary | ICD-10-CM | POA: Diagnosis not present

## 2021-06-09 DIAGNOSIS — M25551 Pain in right hip: Secondary | ICD-10-CM | POA: Diagnosis not present

## 2021-06-09 DIAGNOSIS — M545 Low back pain, unspecified: Secondary | ICD-10-CM | POA: Diagnosis not present

## 2021-06-16 DIAGNOSIS — D519 Vitamin B12 deficiency anemia, unspecified: Secondary | ICD-10-CM | POA: Diagnosis not present

## 2021-07-20 DIAGNOSIS — E876 Hypokalemia: Secondary | ICD-10-CM | POA: Diagnosis not present

## 2021-08-09 DIAGNOSIS — M79651 Pain in right thigh: Secondary | ICD-10-CM | POA: Diagnosis not present

## 2021-08-09 DIAGNOSIS — M79673 Pain in unspecified foot: Secondary | ICD-10-CM | POA: Diagnosis not present

## 2021-08-09 DIAGNOSIS — M545 Low back pain, unspecified: Secondary | ICD-10-CM | POA: Diagnosis not present

## 2021-08-09 DIAGNOSIS — M25561 Pain in right knee: Secondary | ICD-10-CM | POA: Diagnosis not present

## 2021-08-09 DIAGNOSIS — M25551 Pain in right hip: Secondary | ICD-10-CM | POA: Diagnosis not present

## 2021-09-09 DIAGNOSIS — G894 Chronic pain syndrome: Secondary | ICD-10-CM | POA: Diagnosis not present

## 2021-09-09 DIAGNOSIS — F1110 Opioid abuse, uncomplicated: Secondary | ICD-10-CM | POA: Diagnosis not present

## 2021-10-06 DIAGNOSIS — M25551 Pain in right hip: Secondary | ICD-10-CM | POA: Diagnosis not present

## 2021-10-06 DIAGNOSIS — M79671 Pain in right foot: Secondary | ICD-10-CM | POA: Diagnosis not present

## 2021-10-06 DIAGNOSIS — M25561 Pain in right knee: Secondary | ICD-10-CM | POA: Diagnosis not present

## 2021-10-06 DIAGNOSIS — M545 Low back pain, unspecified: Secondary | ICD-10-CM | POA: Diagnosis not present

## 2021-10-06 DIAGNOSIS — M79651 Pain in right thigh: Secondary | ICD-10-CM | POA: Diagnosis not present

## 2021-10-08 DIAGNOSIS — D510 Vitamin B12 deficiency anemia due to intrinsic factor deficiency: Secondary | ICD-10-CM | POA: Diagnosis not present

## 2021-11-08 DIAGNOSIS — M19012 Primary osteoarthritis, left shoulder: Secondary | ICD-10-CM | POA: Diagnosis not present

## 2021-11-10 DIAGNOSIS — I8310 Varicose veins of unspecified lower extremity with inflammation: Secondary | ICD-10-CM | POA: Diagnosis not present

## 2021-11-10 DIAGNOSIS — L97921 Non-pressure chronic ulcer of unspecified part of left lower leg limited to breakdown of skin: Secondary | ICD-10-CM | POA: Diagnosis not present

## 2021-11-19 DIAGNOSIS — I8310 Varicose veins of unspecified lower extremity with inflammation: Secondary | ICD-10-CM | POA: Diagnosis not present

## 2021-11-19 DIAGNOSIS — L97921 Non-pressure chronic ulcer of unspecified part of left lower leg limited to breakdown of skin: Secondary | ICD-10-CM | POA: Diagnosis not present

## 2021-12-01 DIAGNOSIS — D500 Iron deficiency anemia secondary to blood loss (chronic): Secondary | ICD-10-CM | POA: Diagnosis not present

## 2021-12-03 DIAGNOSIS — L0390 Cellulitis, unspecified: Secondary | ICD-10-CM | POA: Diagnosis not present

## 2021-12-03 DIAGNOSIS — L97921 Non-pressure chronic ulcer of unspecified part of left lower leg limited to breakdown of skin: Secondary | ICD-10-CM | POA: Diagnosis not present

## 2021-12-06 DIAGNOSIS — R195 Other fecal abnormalities: Secondary | ICD-10-CM | POA: Diagnosis not present

## 2021-12-06 DIAGNOSIS — D500 Iron deficiency anemia secondary to blood loss (chronic): Secondary | ICD-10-CM | POA: Diagnosis not present

## 2021-12-15 DIAGNOSIS — M25561 Pain in right knee: Secondary | ICD-10-CM | POA: Diagnosis not present

## 2021-12-15 DIAGNOSIS — M79671 Pain in right foot: Secondary | ICD-10-CM | POA: Diagnosis not present

## 2021-12-15 DIAGNOSIS — M545 Low back pain, unspecified: Secondary | ICD-10-CM | POA: Diagnosis not present

## 2021-12-15 DIAGNOSIS — M79651 Pain in right thigh: Secondary | ICD-10-CM | POA: Diagnosis not present

## 2021-12-15 DIAGNOSIS — M25551 Pain in right hip: Secondary | ICD-10-CM | POA: Diagnosis not present

## 2021-12-31 DIAGNOSIS — Z79899 Other long term (current) drug therapy: Secondary | ICD-10-CM | POA: Diagnosis not present

## 2021-12-31 DIAGNOSIS — D519 Vitamin B12 deficiency anemia, unspecified: Secondary | ICD-10-CM | POA: Diagnosis not present

## 2022-01-18 DIAGNOSIS — L821 Other seborrheic keratosis: Secondary | ICD-10-CM | POA: Diagnosis not present

## 2022-01-18 DIAGNOSIS — L814 Other melanin hyperpigmentation: Secondary | ICD-10-CM | POA: Diagnosis not present

## 2022-01-18 DIAGNOSIS — L299 Pruritus, unspecified: Secondary | ICD-10-CM | POA: Diagnosis not present

## 2022-01-18 DIAGNOSIS — C44612 Basal cell carcinoma of skin of right upper limb, including shoulder: Secondary | ICD-10-CM | POA: Diagnosis not present

## 2022-01-18 DIAGNOSIS — D225 Melanocytic nevi of trunk: Secondary | ICD-10-CM | POA: Diagnosis not present

## 2022-01-18 DIAGNOSIS — L578 Other skin changes due to chronic exposure to nonionizing radiation: Secondary | ICD-10-CM | POA: Diagnosis not present

## 2022-01-24 DIAGNOSIS — Z1331 Encounter for screening for depression: Secondary | ICD-10-CM | POA: Diagnosis not present

## 2022-01-24 DIAGNOSIS — Z6835 Body mass index (BMI) 35.0-35.9, adult: Secondary | ICD-10-CM | POA: Diagnosis not present

## 2022-01-24 DIAGNOSIS — Z0000 Encounter for general adult medical examination without abnormal findings: Secondary | ICD-10-CM | POA: Diagnosis not present

## 2022-01-24 DIAGNOSIS — E785 Hyperlipidemia, unspecified: Secondary | ICD-10-CM | POA: Diagnosis not present

## 2022-01-26 DIAGNOSIS — C44612 Basal cell carcinoma of skin of right upper limb, including shoulder: Secondary | ICD-10-CM | POA: Diagnosis not present

## 2022-02-21 DIAGNOSIS — M25551 Pain in right hip: Secondary | ICD-10-CM | POA: Diagnosis not present

## 2022-02-21 DIAGNOSIS — M545 Low back pain, unspecified: Secondary | ICD-10-CM | POA: Diagnosis not present

## 2022-02-21 DIAGNOSIS — M25561 Pain in right knee: Secondary | ICD-10-CM | POA: Diagnosis not present

## 2022-02-21 DIAGNOSIS — M79671 Pain in right foot: Secondary | ICD-10-CM | POA: Diagnosis not present

## 2022-02-21 DIAGNOSIS — M79651 Pain in right thigh: Secondary | ICD-10-CM | POA: Diagnosis not present

## 2022-03-08 DIAGNOSIS — Z23 Encounter for immunization: Secondary | ICD-10-CM | POA: Diagnosis not present

## 2022-03-08 DIAGNOSIS — D519 Vitamin B12 deficiency anemia, unspecified: Secondary | ICD-10-CM | POA: Diagnosis not present

## 2022-03-24 DIAGNOSIS — Z79899 Other long term (current) drug therapy: Secondary | ICD-10-CM | POA: Diagnosis not present

## 2022-03-25 DIAGNOSIS — Z79899 Other long term (current) drug therapy: Secondary | ICD-10-CM | POA: Diagnosis not present

## 2022-03-25 DIAGNOSIS — R5381 Other malaise: Secondary | ICD-10-CM | POA: Diagnosis not present

## 2022-03-25 DIAGNOSIS — T148XXA Other injury of unspecified body region, initial encounter: Secondary | ICD-10-CM | POA: Diagnosis not present

## 2022-03-25 DIAGNOSIS — S72001A Fracture of unspecified part of neck of right femur, initial encounter for closed fracture: Secondary | ICD-10-CM | POA: Diagnosis not present

## 2022-03-25 DIAGNOSIS — R531 Weakness: Secondary | ICD-10-CM | POA: Diagnosis not present

## 2022-03-25 DIAGNOSIS — S72001D Fracture of unspecified part of neck of right femur, subsequent encounter for closed fracture with routine healing: Secondary | ICD-10-CM | POA: Diagnosis not present

## 2022-03-25 DIAGNOSIS — K219 Gastro-esophageal reflux disease without esophagitis: Secondary | ICD-10-CM | POA: Diagnosis not present

## 2022-03-25 DIAGNOSIS — I10 Essential (primary) hypertension: Secondary | ICD-10-CM | POA: Diagnosis not present

## 2022-03-25 DIAGNOSIS — Z96641 Presence of right artificial hip joint: Secondary | ICD-10-CM | POA: Diagnosis not present

## 2022-03-25 DIAGNOSIS — S72051A Unspecified fracture of head of right femur, initial encounter for closed fracture: Secondary | ICD-10-CM | POA: Diagnosis not present

## 2022-03-25 DIAGNOSIS — Z7401 Bed confinement status: Secondary | ICD-10-CM | POA: Diagnosis not present

## 2022-03-25 DIAGNOSIS — W19XXXA Unspecified fall, initial encounter: Secondary | ICD-10-CM | POA: Diagnosis not present

## 2022-03-25 DIAGNOSIS — I110 Hypertensive heart disease with heart failure: Secondary | ICD-10-CM | POA: Diagnosis not present

## 2022-03-25 DIAGNOSIS — Z85850 Personal history of malignant neoplasm of thyroid: Secondary | ICD-10-CM | POA: Diagnosis not present

## 2022-03-25 DIAGNOSIS — I509 Heart failure, unspecified: Secondary | ICD-10-CM | POA: Diagnosis not present

## 2022-03-25 DIAGNOSIS — Z888 Allergy status to other drugs, medicaments and biological substances status: Secondary | ICD-10-CM | POA: Diagnosis not present

## 2022-03-25 DIAGNOSIS — Z471 Aftercare following joint replacement surgery: Secondary | ICD-10-CM | POA: Diagnosis not present

## 2022-03-25 DIAGNOSIS — R9431 Abnormal electrocardiogram [ECG] [EKG]: Secondary | ICD-10-CM | POA: Diagnosis not present

## 2022-03-25 DIAGNOSIS — K449 Diaphragmatic hernia without obstruction or gangrene: Secondary | ICD-10-CM | POA: Diagnosis not present

## 2022-03-25 DIAGNOSIS — S72011A Unspecified intracapsular fracture of right femur, initial encounter for closed fracture: Secondary | ICD-10-CM | POA: Diagnosis not present

## 2022-03-26 DIAGNOSIS — S72051A Unspecified fracture of head of right femur, initial encounter for closed fracture: Secondary | ICD-10-CM | POA: Diagnosis not present

## 2022-03-29 DIAGNOSIS — D649 Anemia, unspecified: Secondary | ICD-10-CM | POA: Diagnosis not present

## 2022-03-29 DIAGNOSIS — S72001D Fracture of unspecified part of neck of right femur, subsequent encounter for closed fracture with routine healing: Secondary | ICD-10-CM | POA: Diagnosis not present

## 2022-03-29 DIAGNOSIS — R531 Weakness: Secondary | ICD-10-CM | POA: Diagnosis not present

## 2022-03-29 DIAGNOSIS — S72009D Fracture of unspecified part of neck of unspecified femur, subsequent encounter for closed fracture with routine healing: Secondary | ICD-10-CM | POA: Diagnosis not present

## 2022-03-29 DIAGNOSIS — I110 Hypertensive heart disease with heart failure: Secondary | ICD-10-CM | POA: Diagnosis not present

## 2022-03-29 DIAGNOSIS — S72011A Unspecified intracapsular fracture of right femur, initial encounter for closed fracture: Secondary | ICD-10-CM | POA: Diagnosis not present

## 2022-03-29 DIAGNOSIS — R0602 Shortness of breath: Secondary | ICD-10-CM | POA: Diagnosis not present

## 2022-03-29 DIAGNOSIS — I509 Heart failure, unspecified: Secondary | ICD-10-CM | POA: Diagnosis not present

## 2022-03-29 DIAGNOSIS — N390 Urinary tract infection, site not specified: Secondary | ICD-10-CM | POA: Diagnosis not present

## 2022-03-29 DIAGNOSIS — Z7401 Bed confinement status: Secondary | ICD-10-CM | POA: Diagnosis not present

## 2022-03-29 DIAGNOSIS — G8918 Other acute postprocedural pain: Secondary | ICD-10-CM | POA: Diagnosis not present

## 2022-03-29 DIAGNOSIS — R262 Difficulty in walking, not elsewhere classified: Secondary | ICD-10-CM | POA: Diagnosis not present

## 2022-03-29 DIAGNOSIS — R5381 Other malaise: Secondary | ICD-10-CM | POA: Diagnosis not present

## 2022-03-30 DIAGNOSIS — G8918 Other acute postprocedural pain: Secondary | ICD-10-CM | POA: Diagnosis not present

## 2022-03-30 DIAGNOSIS — S72009D Fracture of unspecified part of neck of unspecified femur, subsequent encounter for closed fracture with routine healing: Secondary | ICD-10-CM | POA: Diagnosis not present

## 2022-03-30 DIAGNOSIS — D649 Anemia, unspecified: Secondary | ICD-10-CM | POA: Diagnosis not present

## 2022-03-30 DIAGNOSIS — R262 Difficulty in walking, not elsewhere classified: Secondary | ICD-10-CM | POA: Diagnosis not present

## 2022-04-14 DIAGNOSIS — I872 Venous insufficiency (chronic) (peripheral): Secondary | ICD-10-CM | POA: Diagnosis not present

## 2022-04-14 DIAGNOSIS — M8589 Other specified disorders of bone density and structure, multiple sites: Secondary | ICD-10-CM | POA: Diagnosis not present

## 2022-04-14 DIAGNOSIS — Z6835 Body mass index (BMI) 35.0-35.9, adult: Secondary | ICD-10-CM | POA: Diagnosis not present

## 2022-04-14 DIAGNOSIS — M1990 Unspecified osteoarthritis, unspecified site: Secondary | ICD-10-CM | POA: Diagnosis not present

## 2022-04-14 DIAGNOSIS — E7800 Pure hypercholesterolemia, unspecified: Secondary | ICD-10-CM | POA: Diagnosis not present

## 2022-04-14 DIAGNOSIS — I10 Essential (primary) hypertension: Secondary | ICD-10-CM | POA: Diagnosis not present

## 2022-05-10 DIAGNOSIS — S72001A Fracture of unspecified part of neck of right femur, initial encounter for closed fracture: Secondary | ICD-10-CM | POA: Diagnosis not present

## 2022-05-16 DIAGNOSIS — M545 Low back pain, unspecified: Secondary | ICD-10-CM | POA: Diagnosis not present

## 2022-05-16 DIAGNOSIS — M79651 Pain in right thigh: Secondary | ICD-10-CM | POA: Diagnosis not present

## 2022-05-16 DIAGNOSIS — M79671 Pain in right foot: Secondary | ICD-10-CM | POA: Diagnosis not present

## 2022-05-16 DIAGNOSIS — M25561 Pain in right knee: Secondary | ICD-10-CM | POA: Diagnosis not present

## 2022-05-16 DIAGNOSIS — M25551 Pain in right hip: Secondary | ICD-10-CM | POA: Diagnosis not present

## 2022-05-19 DIAGNOSIS — D500 Iron deficiency anemia secondary to blood loss (chronic): Secondary | ICD-10-CM | POA: Diagnosis not present

## 2022-05-25 DIAGNOSIS — D500 Iron deficiency anemia secondary to blood loss (chronic): Secondary | ICD-10-CM | POA: Diagnosis not present

## 2022-05-25 DIAGNOSIS — Z79899 Other long term (current) drug therapy: Secondary | ICD-10-CM | POA: Diagnosis not present

## 2022-06-10 DIAGNOSIS — E611 Iron deficiency: Secondary | ICD-10-CM | POA: Diagnosis not present

## 2022-06-21 DIAGNOSIS — M1711 Unilateral primary osteoarthritis, right knee: Secondary | ICD-10-CM | POA: Diagnosis not present

## 2022-06-23 DIAGNOSIS — F1120 Opioid dependence, uncomplicated: Secondary | ICD-10-CM | POA: Diagnosis not present

## 2022-06-23 DIAGNOSIS — Z79891 Long term (current) use of opiate analgesic: Secondary | ICD-10-CM | POA: Diagnosis not present

## 2022-07-06 DIAGNOSIS — M545 Low back pain, unspecified: Secondary | ICD-10-CM | POA: Diagnosis not present

## 2022-07-06 DIAGNOSIS — M25551 Pain in right hip: Secondary | ICD-10-CM | POA: Diagnosis not present

## 2022-08-30 DIAGNOSIS — E890 Postprocedural hypothyroidism: Secondary | ICD-10-CM | POA: Diagnosis not present

## 2022-08-30 DIAGNOSIS — C73 Malignant neoplasm of thyroid gland: Secondary | ICD-10-CM | POA: Diagnosis not present

## 2022-09-01 DIAGNOSIS — C73 Malignant neoplasm of thyroid gland: Secondary | ICD-10-CM | POA: Diagnosis not present

## 2022-09-20 DIAGNOSIS — S72001A Fracture of unspecified part of neck of right femur, initial encounter for closed fracture: Secondary | ICD-10-CM | POA: Diagnosis not present

## 2022-09-30 DIAGNOSIS — Z6834 Body mass index (BMI) 34.0-34.9, adult: Secondary | ICD-10-CM | POA: Diagnosis not present

## 2022-09-30 DIAGNOSIS — I10 Essential (primary) hypertension: Secondary | ICD-10-CM | POA: Diagnosis not present

## 2022-09-30 DIAGNOSIS — N390 Urinary tract infection, site not specified: Secondary | ICD-10-CM | POA: Diagnosis not present

## 2022-11-10 DIAGNOSIS — D500 Iron deficiency anemia secondary to blood loss (chronic): Secondary | ICD-10-CM | POA: Diagnosis not present

## 2022-11-16 DIAGNOSIS — R195 Other fecal abnormalities: Secondary | ICD-10-CM | POA: Diagnosis not present

## 2022-11-16 DIAGNOSIS — D500 Iron deficiency anemia secondary to blood loss (chronic): Secondary | ICD-10-CM | POA: Diagnosis not present

## 2022-11-21 DIAGNOSIS — M1712 Unilateral primary osteoarthritis, left knee: Secondary | ICD-10-CM | POA: Diagnosis not present

## 2022-11-23 DIAGNOSIS — M7051 Other bursitis of knee, right knee: Secondary | ICD-10-CM | POA: Diagnosis not present

## 2022-11-23 DIAGNOSIS — F32A Depression, unspecified: Secondary | ICD-10-CM | POA: Diagnosis not present

## 2022-11-23 DIAGNOSIS — D500 Iron deficiency anemia secondary to blood loss (chronic): Secondary | ICD-10-CM | POA: Diagnosis not present

## 2022-11-23 DIAGNOSIS — M7060 Trochanteric bursitis, unspecified hip: Secondary | ICD-10-CM | POA: Diagnosis not present

## 2022-11-23 DIAGNOSIS — F419 Anxiety disorder, unspecified: Secondary | ICD-10-CM | POA: Diagnosis not present

## 2022-11-23 DIAGNOSIS — I10 Essential (primary) hypertension: Secondary | ICD-10-CM | POA: Diagnosis not present

## 2022-11-23 DIAGNOSIS — M1712 Unilateral primary osteoarthritis, left knee: Secondary | ICD-10-CM | POA: Diagnosis not present

## 2022-11-23 DIAGNOSIS — I872 Venous insufficiency (chronic) (peripheral): Secondary | ICD-10-CM | POA: Diagnosis not present

## 2022-11-23 DIAGNOSIS — M150 Primary generalized (osteo)arthritis: Secondary | ICD-10-CM | POA: Diagnosis not present

## 2022-11-23 DIAGNOSIS — M7052 Other bursitis of knee, left knee: Secondary | ICD-10-CM | POA: Diagnosis not present

## 2022-12-02 DIAGNOSIS — M150 Primary generalized (osteo)arthritis: Secondary | ICD-10-CM | POA: Diagnosis not present

## 2022-12-02 DIAGNOSIS — I872 Venous insufficiency (chronic) (peripheral): Secondary | ICD-10-CM | POA: Diagnosis not present

## 2022-12-02 DIAGNOSIS — I10 Essential (primary) hypertension: Secondary | ICD-10-CM | POA: Diagnosis not present

## 2022-12-02 DIAGNOSIS — M7051 Other bursitis of knee, right knee: Secondary | ICD-10-CM | POA: Diagnosis not present

## 2022-12-02 DIAGNOSIS — M7052 Other bursitis of knee, left knee: Secondary | ICD-10-CM | POA: Diagnosis not present

## 2022-12-02 DIAGNOSIS — F419 Anxiety disorder, unspecified: Secondary | ICD-10-CM | POA: Diagnosis not present

## 2022-12-02 DIAGNOSIS — M7060 Trochanteric bursitis, unspecified hip: Secondary | ICD-10-CM | POA: Diagnosis not present

## 2022-12-02 DIAGNOSIS — F32A Depression, unspecified: Secondary | ICD-10-CM | POA: Diagnosis not present

## 2022-12-02 DIAGNOSIS — D500 Iron deficiency anemia secondary to blood loss (chronic): Secondary | ICD-10-CM | POA: Diagnosis not present

## 2022-12-05 DIAGNOSIS — M7051 Other bursitis of knee, right knee: Secondary | ICD-10-CM | POA: Diagnosis not present

## 2022-12-05 DIAGNOSIS — F419 Anxiety disorder, unspecified: Secondary | ICD-10-CM | POA: Diagnosis not present

## 2022-12-05 DIAGNOSIS — M7052 Other bursitis of knee, left knee: Secondary | ICD-10-CM | POA: Diagnosis not present

## 2022-12-05 DIAGNOSIS — F32A Depression, unspecified: Secondary | ICD-10-CM | POA: Diagnosis not present

## 2022-12-05 DIAGNOSIS — D500 Iron deficiency anemia secondary to blood loss (chronic): Secondary | ICD-10-CM | POA: Diagnosis not present

## 2022-12-05 DIAGNOSIS — M150 Primary generalized (osteo)arthritis: Secondary | ICD-10-CM | POA: Diagnosis not present

## 2022-12-05 DIAGNOSIS — M7060 Trochanteric bursitis, unspecified hip: Secondary | ICD-10-CM | POA: Diagnosis not present

## 2022-12-05 DIAGNOSIS — I872 Venous insufficiency (chronic) (peripheral): Secondary | ICD-10-CM | POA: Diagnosis not present

## 2022-12-05 DIAGNOSIS — I10 Essential (primary) hypertension: Secondary | ICD-10-CM | POA: Diagnosis not present

## 2022-12-07 DIAGNOSIS — F419 Anxiety disorder, unspecified: Secondary | ICD-10-CM | POA: Diagnosis not present

## 2022-12-07 DIAGNOSIS — I872 Venous insufficiency (chronic) (peripheral): Secondary | ICD-10-CM | POA: Diagnosis not present

## 2022-12-07 DIAGNOSIS — M150 Primary generalized (osteo)arthritis: Secondary | ICD-10-CM | POA: Diagnosis not present

## 2022-12-07 DIAGNOSIS — M7060 Trochanteric bursitis, unspecified hip: Secondary | ICD-10-CM | POA: Diagnosis not present

## 2022-12-07 DIAGNOSIS — F32A Depression, unspecified: Secondary | ICD-10-CM | POA: Diagnosis not present

## 2022-12-07 DIAGNOSIS — D500 Iron deficiency anemia secondary to blood loss (chronic): Secondary | ICD-10-CM | POA: Diagnosis not present

## 2022-12-07 DIAGNOSIS — M7051 Other bursitis of knee, right knee: Secondary | ICD-10-CM | POA: Diagnosis not present

## 2022-12-07 DIAGNOSIS — I10 Essential (primary) hypertension: Secondary | ICD-10-CM | POA: Diagnosis not present

## 2022-12-07 DIAGNOSIS — M7052 Other bursitis of knee, left knee: Secondary | ICD-10-CM | POA: Diagnosis not present

## 2022-12-13 DIAGNOSIS — D500 Iron deficiency anemia secondary to blood loss (chronic): Secondary | ICD-10-CM | POA: Diagnosis not present

## 2022-12-13 DIAGNOSIS — M7051 Other bursitis of knee, right knee: Secondary | ICD-10-CM | POA: Diagnosis not present

## 2022-12-13 DIAGNOSIS — M7052 Other bursitis of knee, left knee: Secondary | ICD-10-CM | POA: Diagnosis not present

## 2022-12-13 DIAGNOSIS — F32A Depression, unspecified: Secondary | ICD-10-CM | POA: Diagnosis not present

## 2022-12-13 DIAGNOSIS — M7060 Trochanteric bursitis, unspecified hip: Secondary | ICD-10-CM | POA: Diagnosis not present

## 2022-12-13 DIAGNOSIS — I872 Venous insufficiency (chronic) (peripheral): Secondary | ICD-10-CM | POA: Diagnosis not present

## 2022-12-13 DIAGNOSIS — F419 Anxiety disorder, unspecified: Secondary | ICD-10-CM | POA: Diagnosis not present

## 2022-12-13 DIAGNOSIS — I10 Essential (primary) hypertension: Secondary | ICD-10-CM | POA: Diagnosis not present

## 2022-12-13 DIAGNOSIS — M150 Primary generalized (osteo)arthritis: Secondary | ICD-10-CM | POA: Diagnosis not present

## 2022-12-14 DIAGNOSIS — M25551 Pain in right hip: Secondary | ICD-10-CM | POA: Diagnosis not present

## 2022-12-14 DIAGNOSIS — M545 Low back pain, unspecified: Secondary | ICD-10-CM | POA: Diagnosis not present

## 2022-12-15 DIAGNOSIS — M150 Primary generalized (osteo)arthritis: Secondary | ICD-10-CM | POA: Diagnosis not present

## 2022-12-15 DIAGNOSIS — M7060 Trochanteric bursitis, unspecified hip: Secondary | ICD-10-CM | POA: Diagnosis not present

## 2022-12-15 DIAGNOSIS — F32A Depression, unspecified: Secondary | ICD-10-CM | POA: Diagnosis not present

## 2022-12-15 DIAGNOSIS — I10 Essential (primary) hypertension: Secondary | ICD-10-CM | POA: Diagnosis not present

## 2022-12-15 DIAGNOSIS — M7051 Other bursitis of knee, right knee: Secondary | ICD-10-CM | POA: Diagnosis not present

## 2022-12-15 DIAGNOSIS — F419 Anxiety disorder, unspecified: Secondary | ICD-10-CM | POA: Diagnosis not present

## 2022-12-15 DIAGNOSIS — D500 Iron deficiency anemia secondary to blood loss (chronic): Secondary | ICD-10-CM | POA: Diagnosis not present

## 2022-12-15 DIAGNOSIS — I872 Venous insufficiency (chronic) (peripheral): Secondary | ICD-10-CM | POA: Diagnosis not present

## 2022-12-15 DIAGNOSIS — M7052 Other bursitis of knee, left knee: Secondary | ICD-10-CM | POA: Diagnosis not present

## 2022-12-19 DIAGNOSIS — M7060 Trochanteric bursitis, unspecified hip: Secondary | ICD-10-CM | POA: Diagnosis not present

## 2022-12-19 DIAGNOSIS — M150 Primary generalized (osteo)arthritis: Secondary | ICD-10-CM | POA: Diagnosis not present

## 2022-12-19 DIAGNOSIS — M7051 Other bursitis of knee, right knee: Secondary | ICD-10-CM | POA: Diagnosis not present

## 2022-12-19 DIAGNOSIS — I872 Venous insufficiency (chronic) (peripheral): Secondary | ICD-10-CM | POA: Diagnosis not present

## 2022-12-19 DIAGNOSIS — M7052 Other bursitis of knee, left knee: Secondary | ICD-10-CM | POA: Diagnosis not present

## 2022-12-19 DIAGNOSIS — F32A Depression, unspecified: Secondary | ICD-10-CM | POA: Diagnosis not present

## 2022-12-19 DIAGNOSIS — D500 Iron deficiency anemia secondary to blood loss (chronic): Secondary | ICD-10-CM | POA: Diagnosis not present

## 2022-12-19 DIAGNOSIS — F419 Anxiety disorder, unspecified: Secondary | ICD-10-CM | POA: Diagnosis not present

## 2022-12-19 DIAGNOSIS — I10 Essential (primary) hypertension: Secondary | ICD-10-CM | POA: Diagnosis not present

## 2022-12-23 DIAGNOSIS — F32A Depression, unspecified: Secondary | ICD-10-CM | POA: Diagnosis not present

## 2022-12-23 DIAGNOSIS — M7051 Other bursitis of knee, right knee: Secondary | ICD-10-CM | POA: Diagnosis not present

## 2022-12-23 DIAGNOSIS — D500 Iron deficiency anemia secondary to blood loss (chronic): Secondary | ICD-10-CM | POA: Diagnosis not present

## 2022-12-23 DIAGNOSIS — M7060 Trochanteric bursitis, unspecified hip: Secondary | ICD-10-CM | POA: Diagnosis not present

## 2022-12-23 DIAGNOSIS — F419 Anxiety disorder, unspecified: Secondary | ICD-10-CM | POA: Diagnosis not present

## 2022-12-23 DIAGNOSIS — I872 Venous insufficiency (chronic) (peripheral): Secondary | ICD-10-CM | POA: Diagnosis not present

## 2022-12-23 DIAGNOSIS — M7052 Other bursitis of knee, left knee: Secondary | ICD-10-CM | POA: Diagnosis not present

## 2022-12-23 DIAGNOSIS — M150 Primary generalized (osteo)arthritis: Secondary | ICD-10-CM | POA: Diagnosis not present

## 2022-12-23 DIAGNOSIS — I10 Essential (primary) hypertension: Secondary | ICD-10-CM | POA: Diagnosis not present

## 2022-12-29 DIAGNOSIS — I872 Venous insufficiency (chronic) (peripheral): Secondary | ICD-10-CM | POA: Diagnosis not present

## 2022-12-29 DIAGNOSIS — M150 Primary generalized (osteo)arthritis: Secondary | ICD-10-CM | POA: Diagnosis not present

## 2022-12-29 DIAGNOSIS — I10 Essential (primary) hypertension: Secondary | ICD-10-CM | POA: Diagnosis not present

## 2022-12-29 DIAGNOSIS — D500 Iron deficiency anemia secondary to blood loss (chronic): Secondary | ICD-10-CM | POA: Diagnosis not present

## 2022-12-29 DIAGNOSIS — F32A Depression, unspecified: Secondary | ICD-10-CM | POA: Diagnosis not present

## 2022-12-29 DIAGNOSIS — M7060 Trochanteric bursitis, unspecified hip: Secondary | ICD-10-CM | POA: Diagnosis not present

## 2022-12-29 DIAGNOSIS — F419 Anxiety disorder, unspecified: Secondary | ICD-10-CM | POA: Diagnosis not present

## 2022-12-29 DIAGNOSIS — M7052 Other bursitis of knee, left knee: Secondary | ICD-10-CM | POA: Diagnosis not present

## 2022-12-29 DIAGNOSIS — M7051 Other bursitis of knee, right knee: Secondary | ICD-10-CM | POA: Diagnosis not present

## 2023-01-02 DIAGNOSIS — Z6835 Body mass index (BMI) 35.0-35.9, adult: Secondary | ICD-10-CM | POA: Diagnosis not present

## 2023-01-02 DIAGNOSIS — M1712 Unilateral primary osteoarthritis, left knee: Secondary | ICD-10-CM | POA: Diagnosis not present

## 2023-01-02 DIAGNOSIS — D519 Vitamin B12 deficiency anemia, unspecified: Secondary | ICD-10-CM | POA: Diagnosis not present

## 2023-01-02 DIAGNOSIS — M85859 Other specified disorders of bone density and structure, unspecified thigh: Secondary | ICD-10-CM | POA: Diagnosis not present

## 2023-01-11 DIAGNOSIS — I872 Venous insufficiency (chronic) (peripheral): Secondary | ICD-10-CM | POA: Diagnosis not present

## 2023-01-11 DIAGNOSIS — M7060 Trochanteric bursitis, unspecified hip: Secondary | ICD-10-CM | POA: Diagnosis not present

## 2023-01-11 DIAGNOSIS — M7051 Other bursitis of knee, right knee: Secondary | ICD-10-CM | POA: Diagnosis not present

## 2023-01-11 DIAGNOSIS — M150 Primary generalized (osteo)arthritis: Secondary | ICD-10-CM | POA: Diagnosis not present

## 2023-01-11 DIAGNOSIS — F419 Anxiety disorder, unspecified: Secondary | ICD-10-CM | POA: Diagnosis not present

## 2023-01-11 DIAGNOSIS — F32A Depression, unspecified: Secondary | ICD-10-CM | POA: Diagnosis not present

## 2023-01-11 DIAGNOSIS — D500 Iron deficiency anemia secondary to blood loss (chronic): Secondary | ICD-10-CM | POA: Diagnosis not present

## 2023-01-11 DIAGNOSIS — I10 Essential (primary) hypertension: Secondary | ICD-10-CM | POA: Diagnosis not present

## 2023-01-11 DIAGNOSIS — M7052 Other bursitis of knee, left knee: Secondary | ICD-10-CM | POA: Diagnosis not present

## 2023-01-17 DIAGNOSIS — D2239 Melanocytic nevi of other parts of face: Secondary | ICD-10-CM | POA: Diagnosis not present

## 2023-01-17 DIAGNOSIS — L814 Other melanin hyperpigmentation: Secondary | ICD-10-CM | POA: Diagnosis not present

## 2023-01-17 DIAGNOSIS — L821 Other seborrheic keratosis: Secondary | ICD-10-CM | POA: Diagnosis not present

## 2023-01-17 DIAGNOSIS — D225 Melanocytic nevi of trunk: Secondary | ICD-10-CM | POA: Diagnosis not present

## 2023-01-19 DIAGNOSIS — L97522 Non-pressure chronic ulcer of other part of left foot with fat layer exposed: Secondary | ICD-10-CM | POA: Diagnosis not present

## 2023-01-19 DIAGNOSIS — L03032 Cellulitis of left toe: Secondary | ICD-10-CM | POA: Diagnosis not present

## 2023-01-27 DIAGNOSIS — Z79891 Long term (current) use of opiate analgesic: Secondary | ICD-10-CM | POA: Diagnosis not present

## 2023-02-02 DIAGNOSIS — L03032 Cellulitis of left toe: Secondary | ICD-10-CM | POA: Diagnosis not present

## 2023-02-02 DIAGNOSIS — L97521 Non-pressure chronic ulcer of other part of left foot limited to breakdown of skin: Secondary | ICD-10-CM | POA: Diagnosis not present

## 2023-02-15 DIAGNOSIS — M1712 Unilateral primary osteoarthritis, left knee: Secondary | ICD-10-CM | POA: Diagnosis not present

## 2023-02-16 DIAGNOSIS — L03032 Cellulitis of left toe: Secondary | ICD-10-CM | POA: Diagnosis not present

## 2023-02-16 DIAGNOSIS — L97521 Non-pressure chronic ulcer of other part of left foot limited to breakdown of skin: Secondary | ICD-10-CM | POA: Diagnosis not present

## 2023-02-20 DIAGNOSIS — M25551 Pain in right hip: Secondary | ICD-10-CM | POA: Diagnosis not present

## 2023-02-20 DIAGNOSIS — M545 Low back pain, unspecified: Secondary | ICD-10-CM | POA: Diagnosis not present

## 2023-02-28 DIAGNOSIS — H5213 Myopia, bilateral: Secondary | ICD-10-CM | POA: Diagnosis not present

## 2023-02-28 DIAGNOSIS — H524 Presbyopia: Secondary | ICD-10-CM | POA: Diagnosis not present

## 2023-02-28 DIAGNOSIS — H26493 Other secondary cataract, bilateral: Secondary | ICD-10-CM | POA: Diagnosis not present

## 2023-02-28 DIAGNOSIS — H04123 Dry eye syndrome of bilateral lacrimal glands: Secondary | ICD-10-CM | POA: Diagnosis not present

## 2023-02-28 DIAGNOSIS — H52222 Regular astigmatism, left eye: Secondary | ICD-10-CM | POA: Diagnosis not present

## 2023-02-28 DIAGNOSIS — H4322 Crystalline deposits in vitreous body, left eye: Secondary | ICD-10-CM | POA: Diagnosis not present

## 2023-03-21 DIAGNOSIS — S72001A Fracture of unspecified part of neck of right femur, initial encounter for closed fracture: Secondary | ICD-10-CM | POA: Diagnosis not present

## 2023-03-28 DIAGNOSIS — M1711 Unilateral primary osteoarthritis, right knee: Secondary | ICD-10-CM | POA: Diagnosis not present

## 2023-03-28 DIAGNOSIS — M25461 Effusion, right knee: Secondary | ICD-10-CM | POA: Diagnosis not present

## 2023-04-11 DIAGNOSIS — Z23 Encounter for immunization: Secondary | ICD-10-CM | POA: Diagnosis not present

## 2023-04-18 DIAGNOSIS — Z9181 History of falling: Secondary | ICD-10-CM | POA: Diagnosis not present

## 2023-04-18 DIAGNOSIS — Z79899 Other long term (current) drug therapy: Secondary | ICD-10-CM | POA: Diagnosis not present

## 2023-04-18 DIAGNOSIS — Z0000 Encounter for general adult medical examination without abnormal findings: Secondary | ICD-10-CM | POA: Diagnosis not present

## 2023-04-18 DIAGNOSIS — E890 Postprocedural hypothyroidism: Secondary | ICD-10-CM | POA: Diagnosis not present

## 2023-04-18 DIAGNOSIS — E611 Iron deficiency: Secondary | ICD-10-CM | POA: Diagnosis not present

## 2023-04-18 DIAGNOSIS — I10 Essential (primary) hypertension: Secondary | ICD-10-CM | POA: Diagnosis not present

## 2023-04-18 DIAGNOSIS — K219 Gastro-esophageal reflux disease without esophagitis: Secondary | ICD-10-CM | POA: Diagnosis not present

## 2023-04-18 DIAGNOSIS — Z1339 Encounter for screening examination for other mental health and behavioral disorders: Secondary | ICD-10-CM | POA: Diagnosis not present

## 2023-04-18 DIAGNOSIS — Z6835 Body mass index (BMI) 35.0-35.9, adult: Secondary | ICD-10-CM | POA: Diagnosis not present

## 2023-05-12 DIAGNOSIS — Z23 Encounter for immunization: Secondary | ICD-10-CM | POA: Diagnosis not present

## 2023-05-15 DIAGNOSIS — M25562 Pain in left knee: Secondary | ICD-10-CM | POA: Diagnosis not present

## 2023-05-15 DIAGNOSIS — M545 Low back pain, unspecified: Secondary | ICD-10-CM | POA: Diagnosis not present

## 2023-05-15 DIAGNOSIS — M25551 Pain in right hip: Secondary | ICD-10-CM | POA: Diagnosis not present

## 2023-06-21 DIAGNOSIS — M1712 Unilateral primary osteoarthritis, left knee: Secondary | ICD-10-CM | POA: Diagnosis not present

## 2023-08-18 DIAGNOSIS — R300 Dysuria: Secondary | ICD-10-CM | POA: Diagnosis not present

## 2023-08-18 DIAGNOSIS — Z85850 Personal history of malignant neoplasm of thyroid: Secondary | ICD-10-CM | POA: Diagnosis not present

## 2023-08-18 DIAGNOSIS — Z6835 Body mass index (BMI) 35.0-35.9, adult: Secondary | ICD-10-CM | POA: Diagnosis not present

## 2023-08-18 DIAGNOSIS — E890 Postprocedural hypothyroidism: Secondary | ICD-10-CM | POA: Diagnosis not present

## 2023-09-04 DIAGNOSIS — M25551 Pain in right hip: Secondary | ICD-10-CM | POA: Diagnosis not present

## 2023-09-04 DIAGNOSIS — M545 Low back pain, unspecified: Secondary | ICD-10-CM | POA: Diagnosis not present

## 2023-09-04 DIAGNOSIS — M25562 Pain in left knee: Secondary | ICD-10-CM | POA: Diagnosis not present

## 2023-09-27 DIAGNOSIS — M1712 Unilateral primary osteoarthritis, left knee: Secondary | ICD-10-CM | POA: Diagnosis not present

## 2023-12-27 DIAGNOSIS — M1711 Unilateral primary osteoarthritis, right knee: Secondary | ICD-10-CM | POA: Diagnosis not present

## 2024-01-01 DIAGNOSIS — M25551 Pain in right hip: Secondary | ICD-10-CM | POA: Diagnosis not present

## 2024-01-01 DIAGNOSIS — M545 Low back pain, unspecified: Secondary | ICD-10-CM | POA: Diagnosis not present

## 2024-01-05 DIAGNOSIS — M85859 Other specified disorders of bone density and structure, unspecified thigh: Secondary | ICD-10-CM | POA: Diagnosis not present

## 2024-02-08 DIAGNOSIS — L814 Other melanin hyperpigmentation: Secondary | ICD-10-CM | POA: Diagnosis not present

## 2024-02-08 DIAGNOSIS — L300 Nummular dermatitis: Secondary | ICD-10-CM | POA: Diagnosis not present

## 2024-02-08 DIAGNOSIS — D225 Melanocytic nevi of trunk: Secondary | ICD-10-CM | POA: Diagnosis not present

## 2024-02-08 DIAGNOSIS — D2239 Melanocytic nevi of other parts of face: Secondary | ICD-10-CM | POA: Diagnosis not present

## 2024-02-08 DIAGNOSIS — L299 Pruritus, unspecified: Secondary | ICD-10-CM | POA: Diagnosis not present

## 2024-03-05 DIAGNOSIS — H52222 Regular astigmatism, left eye: Secondary | ICD-10-CM | POA: Diagnosis not present

## 2024-03-05 DIAGNOSIS — H5213 Myopia, bilateral: Secondary | ICD-10-CM | POA: Diagnosis not present

## 2024-03-05 DIAGNOSIS — H26493 Other secondary cataract, bilateral: Secondary | ICD-10-CM | POA: Diagnosis not present

## 2024-03-05 DIAGNOSIS — H524 Presbyopia: Secondary | ICD-10-CM | POA: Diagnosis not present

## 2024-03-05 DIAGNOSIS — H04123 Dry eye syndrome of bilateral lacrimal glands: Secondary | ICD-10-CM | POA: Diagnosis not present

## 2024-04-01 DIAGNOSIS — M545 Low back pain, unspecified: Secondary | ICD-10-CM | POA: Diagnosis not present

## 2024-04-01 DIAGNOSIS — M25551 Pain in right hip: Secondary | ICD-10-CM | POA: Diagnosis not present

## 2024-04-19 DIAGNOSIS — Z0000 Encounter for general adult medical examination without abnormal findings: Secondary | ICD-10-CM | POA: Diagnosis not present

## 2024-04-19 DIAGNOSIS — E890 Postprocedural hypothyroidism: Secondary | ICD-10-CM | POA: Diagnosis not present

## 2024-04-19 DIAGNOSIS — K219 Gastro-esophageal reflux disease without esophagitis: Secondary | ICD-10-CM | POA: Diagnosis not present

## 2024-04-19 DIAGNOSIS — Z85850 Personal history of malignant neoplasm of thyroid: Secondary | ICD-10-CM | POA: Diagnosis not present

## 2024-04-19 DIAGNOSIS — N390 Urinary tract infection, site not specified: Secondary | ICD-10-CM | POA: Diagnosis not present

## 2024-04-19 DIAGNOSIS — D509 Iron deficiency anemia, unspecified: Secondary | ICD-10-CM | POA: Diagnosis not present

## 2024-04-19 DIAGNOSIS — Z23 Encounter for immunization: Secondary | ICD-10-CM | POA: Diagnosis not present

## 2024-04-19 DIAGNOSIS — Z9181 History of falling: Secondary | ICD-10-CM | POA: Diagnosis not present

## 2024-04-19 DIAGNOSIS — I10 Essential (primary) hypertension: Secondary | ICD-10-CM | POA: Diagnosis not present

## 2024-05-07 DIAGNOSIS — M1712 Unilateral primary osteoarthritis, left knee: Secondary | ICD-10-CM | POA: Diagnosis not present
